# Patient Record
Sex: Female | Born: 1975 | Race: Black or African American | Hispanic: No | Marital: Single | Smoking: Never smoker
Health system: Southern US, Community
[De-identification: ages and names within clinical notes are randomized; demographics above are authoritative.]

## PROBLEM LIST (undated history)

## (undated) HISTORY — PX: TUBAL LIGATION: SHX77

## (undated) HISTORY — PX: HERNIA REPAIR: SHX51

## (undated) HISTORY — PX: ABDOMINAL HYSTERECTOMY: SHX81

---

## 2003-04-04 ENCOUNTER — Emergency Department (HOSPITAL_COMMUNITY): Admission: EM | Admit: 2003-04-04 | Discharge: 2003-04-05 | Payer: Self-pay | Admitting: *Deleted

## 2003-12-06 ENCOUNTER — Emergency Department (HOSPITAL_COMMUNITY): Admission: EM | Admit: 2003-12-06 | Discharge: 2003-12-07 | Payer: Self-pay

## 2004-06-28 ENCOUNTER — Emergency Department (HOSPITAL_COMMUNITY): Admission: EM | Admit: 2004-06-28 | Discharge: 2004-06-28 | Payer: Self-pay | Admitting: Emergency Medicine

## 2006-05-25 ENCOUNTER — Emergency Department (HOSPITAL_COMMUNITY): Admission: EM | Admit: 2006-05-25 | Discharge: 2006-05-25 | Payer: Self-pay | Admitting: Emergency Medicine

## 2009-08-07 ENCOUNTER — Emergency Department (HOSPITAL_COMMUNITY): Admission: EM | Admit: 2009-08-07 | Discharge: 2009-08-07 | Payer: Self-pay | Admitting: Family Medicine

## 2010-04-23 HISTORY — PX: ELBOW SURGERY: SHX618

## 2010-07-11 LAB — POCT RAPID STREP A (OFFICE): Streptococcus, Group A Screen (Direct): NEGATIVE

## 2010-07-21 ENCOUNTER — Emergency Department (HOSPITAL_COMMUNITY): Payer: 59

## 2010-07-21 ENCOUNTER — Encounter (HOSPITAL_COMMUNITY): Payer: Self-pay | Admitting: Radiology

## 2010-07-21 ENCOUNTER — Emergency Department (HOSPITAL_COMMUNITY)
Admission: EM | Admit: 2010-07-21 | Discharge: 2010-07-21 | Disposition: A | Discharge status: Home / Self Care | Payer: 59 | Attending: Emergency Medicine | Admitting: Emergency Medicine

## 2010-07-21 DIAGNOSIS — S52123A Displaced fracture of head of unspecified radius, initial encounter for closed fracture: Secondary | ICD-10-CM | POA: Insufficient documentation

## 2010-07-21 DIAGNOSIS — IMO0002 Reserved for concepts with insufficient information to code with codable children: Secondary | ICD-10-CM | POA: Insufficient documentation

## 2010-07-21 DIAGNOSIS — M25539 Pain in unspecified wrist: Secondary | ICD-10-CM | POA: Insufficient documentation

## 2010-07-21 DIAGNOSIS — R071 Chest pain on breathing: Secondary | ICD-10-CM | POA: Insufficient documentation

## 2010-07-21 DIAGNOSIS — R1012 Left upper quadrant pain: Secondary | ICD-10-CM | POA: Insufficient documentation

## 2010-07-21 LAB — POCT PREGNANCY, URINE: Preg Test, Ur: NEGATIVE

## 2010-07-21 MED ORDER — IOHEXOL 300 MG/ML  SOLN
100.0000 mL | Freq: Once | INTRAMUSCULAR | Status: AC | PRN
  Administered 2010-07-21: 100 mL via INTRAVENOUS

## 2010-07-24 ENCOUNTER — Other Ambulatory Visit: Payer: Self-pay | Admitting: Specialist

## 2010-07-24 DIAGNOSIS — M25531 Pain in right wrist: Secondary | ICD-10-CM

## 2010-07-25 ENCOUNTER — Other Ambulatory Visit: Payer: No Typology Code available for payment source

## 2010-07-25 ENCOUNTER — Ambulatory Visit
Admission: RE | Admit: 2010-07-25 | Discharge: 2010-07-25 | Disposition: A | Discharge status: Home / Self Care | Payer: 59 | Source: Ambulatory Visit | Attending: Specialist | Admitting: Specialist

## 2010-07-25 DIAGNOSIS — M25531 Pain in right wrist: Secondary | ICD-10-CM

## 2010-09-06 ENCOUNTER — Ambulatory Visit (HOSPITAL_BASED_OUTPATIENT_CLINIC_OR_DEPARTMENT_OTHER)
Admission: RE | Admit: 2010-09-06 | Discharge: 2010-09-07 | Disposition: A | Discharge status: Home / Self Care | Payer: 59 | Source: Ambulatory Visit | Attending: Orthopedic Surgery | Admitting: Orthopedic Surgery

## 2010-09-06 DIAGNOSIS — Y9289 Other specified places as the place of occurrence of the external cause: Secondary | ICD-10-CM | POA: Insufficient documentation

## 2010-09-06 DIAGNOSIS — Y998 Other external cause status: Secondary | ICD-10-CM | POA: Insufficient documentation

## 2010-09-06 DIAGNOSIS — E669 Obesity, unspecified: Secondary | ICD-10-CM | POA: Insufficient documentation

## 2010-09-06 DIAGNOSIS — W19XXXA Unspecified fall, initial encounter: Secondary | ICD-10-CM | POA: Insufficient documentation

## 2010-09-06 DIAGNOSIS — IMO0002 Reserved for concepts with insufficient information to code with codable children: Secondary | ICD-10-CM | POA: Insufficient documentation

## 2010-09-06 DIAGNOSIS — S52123A Displaced fracture of head of unspecified radius, initial encounter for closed fracture: Secondary | ICD-10-CM | POA: Insufficient documentation

## 2010-09-06 LAB — POCT HEMOGLOBIN-HEMACUE: Hemoglobin: 13.5 g/dL (ref 12.0–15.0)
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
## 2012-09-22 ENCOUNTER — Ambulatory Visit: Payer: BC Managed Care – PPO

## 2012-09-22 ENCOUNTER — Ambulatory Visit (INDEPENDENT_AMBULATORY_CARE_PROVIDER_SITE_OTHER): Payer: BC Managed Care – PPO | Admitting: Internal Medicine

## 2012-09-22 ENCOUNTER — Other Ambulatory Visit: Payer: Self-pay | Admitting: Internal Medicine

## 2012-09-22 VITALS — BP 108/78 | HR 67 | Temp 97.8°F | Resp 16 | Ht 59.3 in | Wt 170.6 lb

## 2012-09-22 DIAGNOSIS — M25421 Effusion, right elbow: Secondary | ICD-10-CM

## 2012-09-22 DIAGNOSIS — R202 Paresthesia of skin: Secondary | ICD-10-CM

## 2012-09-22 DIAGNOSIS — Z111 Encounter for screening for respiratory tuberculosis: Secondary | ICD-10-CM

## 2012-09-22 DIAGNOSIS — R209 Unspecified disturbances of skin sensation: Secondary | ICD-10-CM

## 2012-09-22 DIAGNOSIS — M25429 Effusion, unspecified elbow: Secondary | ICD-10-CM

## 2012-09-22 NOTE — Patient Instructions (Signed)
Tuberculosis Tuberculosis (TB) is a serious infection that lasts for years if it is not treated. TB usually attacks the lungs, but almost any part of the body can be affected. TB can be cured with medicines. If TB is not treated completely, it damages the lungs and other parts of the body and may be life-threatening. Caregivers are required by law to report all cases of TB to the Department of Health. The Department of Healthhelps to identify other people who have TB and to protect other people from getting TB. CAUSES  TB is caused by a bacteria called Mycobacterium tuberculosis. It is easily spread from person to person (contagious). This can occur when an infected person coughs or sneezes, releasing tiny droplets into the air. Another person can then breathe the bacteria into the lungs, causing infection. SYMPTOMS   Cough.  Weight loss.  Fatigue.  Fever.  Sweating.  Chills.  Loss of appetite. DIAGNOSIS  Your caregiver may perform a skin test. A substance is injected under the skin, and your caregiver will recheck the area in 48 to 72 hours to see how your body reacts. Your caregiver may also use blood tests, chest X-rays, and sputum tests to determine whether you have TB. TREATMENT  TB is treated with antibiotic medicines. You may need to take antibiotics for 6 to 9 months. Antibiotics commonly given for TB include:  Isoniazid.  Rifampin.  Ethambutol.  Pyrazinamide. HOME CARE INSTRUCTIONS   Take your antibiotics as directed. Finish them even if you start to feel better.  Keep all follow-up appointments as directed by your caregiver. Regular follow-up visits are required to make sure your medicines are working. Follow-up visits are needed for at least 2 years to make sure the illness remains under control.  Tell your caregiver about all of the people you live with or have close contact with. Your caregiver or the Department of Health will contact these people about also being  tested for TB.  Eat a well-balanced diet.  Rest as needed.  Until your caregiver says you are no longer contagious:  Avoid close contact with others, especially babies and elderly people. They are much more likely to catch TB.  Cover your mouth and nose when you cough or sneeze. Dispose of used tissues properly.  Wash your hands frequently with soap and water.  Do not go back to work or school. SEEK MEDICAL CARE IF:   You have new problems that may be caused by your medicine.  You lose your appetite, feel nauseous, or vomit.  Your urine becomes dark yellow.  Your skin or the white part of your eyes turns a yellowish color.  Your symptoms do not go away or get worse.  You have a new cough, or your cough lasts longer than 3 to 4 weeks.  You keep losing weight.  The patient is a baby older than 3 months with a rectal temperature of 100.5 F (38.1 C) or higher for more than 1 day. SEEK IMMEDIATE MEDICAL CARE IF:   You have chest pain or cough up blood.  You have trouble breathing or shortness of breath.  You have a headache or neck stiffness.  You have a fever.  The patient is a baby older than 3 months with a rectal temperature of 102 F (38.9 C) or higher.  The patient is a baby 3 months old or younger with a rectal temperature of 100.4 F (38 C) or higher. MAKE SURE YOU:  Understand these instructions.    older than 3 months with a rectal temperature of 102° F (38.9° C) or higher.  · The patient is a baby 3 months old or younger with a rectal temperature of 100.4° F (38° C) or higher.  MAKE SURE YOU:  · Understand these instructions.  · Will watch your condition.  · Will get help right away if you are not doing well or get worse.  Document Released: 04/06/2000 Document Revised: 07/02/2011 Document Reviewed: 12/07/2010  ExitCare® Patient Information ©2014 [employer].

## 2012-09-22 NOTE — Progress Notes (Signed)
  Tuberculosis Risk Questionnaire  1. Were you born outside the [place] in one of the following parts of the world: [place], [place], [place], [place] America or Eastern [place]?  No  2. Have you traveled outside the [place] and lived for more than one month in one of the following parts of the world: [place], [place], [place], [place] or [place]?  No  3. Do you have a compromised immune system such as from any of the following conditions:HIV/AIDS, organ or bone marrow transplantation, diabetes, immunosuppressive medicines (e.g. Prednisone, Remicaide), leukemia, lymphoma, cancer of the head or neck, gastrectomy or jejunal bypass, end-stage renal disease (on dialysis), or silicosis?  No   4. 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  Yes  5. Have you ever: injected illegal drugs, used crack cocaine, lived in a homeless shelter  or been in jail or prison?   No  6. Have you ever been exposed to anyone with infectious tuberculosis?  No   Tuberculosis Symptom Questionnaire  Do you currently have any of the following symptoms?  1. Unexplained cough lasting more than 3 weeks? No  Unexplained fever lasting more than 3 weeks. No   3. Night Sweats (sweating that leaves the bedclothes and sheets wet)   No  4. Shortness of Breath No  5. Chest Pain No  6. Unintentional weight loss  No  7. Unexplained fatigue (very tired for no reason) No  
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
## 2012-11-21 ENCOUNTER — Ambulatory Visit: Payer: BC Managed Care – PPO

## 2012-11-21 ENCOUNTER — Ambulatory Visit (INDEPENDENT_AMBULATORY_CARE_PROVIDER_SITE_OTHER): Payer: BC Managed Care – PPO | Admitting: Family Medicine

## 2012-11-21 VITALS — BP 110/78 | HR 86 | Temp 98.0°F | Resp 16 | Ht 59.0 in | Wt 173.0 lb

## 2012-11-21 DIAGNOSIS — M25521 Pain in right elbow: Secondary | ICD-10-CM

## 2012-11-21 DIAGNOSIS — M25529 Pain in unspecified elbow: Secondary | ICD-10-CM

## 2012-11-21 MED ORDER — TRAMADOL HCL 50 MG PO TABS: 50.0000 mg | ORAL_TABLET | Freq: Three times a day (TID) | ORAL | Status: DC | PRN

## 2012-11-21 MED ORDER — MELOXICAM 7.5 MG PO TABS: ORAL_TABLET | ORAL | Status: DC

## 2012-11-21 NOTE — Patient Instructions (Addendum)
Please see Dr. Merlyn Lot on Monday as planned.  Bring him the copy of your x-ray on CD.  Use the mobic (one or two daily) as needed for pain.  For more severe pain you can use the tramadol, but be careful as it could make you sleepy.    Wear the arm sling as needed for support and comfort.   Remember to perform elbow range of motion exercises several times a day.    If any change or severe pain please call, come back in or go to the ER.

## 2012-11-21 NOTE — Progress Notes (Signed)
[facility] [address] [place] [address] [phone]  Date:  11/21/2012   Name:  Sierra Garner   DOB:  Dec 29, 1975   MRN:  [id number]  PCP:  No primary provider on file.    Chief Complaint: Shoulder Pain   History of Present Illness:  Sierra Garner is a 37 y.o. very pleasant female patient who presents with the following:  Here today with an MSK problem.   For the past week or so she has noted tingling and pain in her right fifth finger.  The tingling will come and go, and sometimes she cannot hold her purse in her right hand. She also notes increasing pain and some swelling over the lateral right elbow.  No heat.     She is right handed.   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   She did break her elbow and had a repair done in 2012 per Dr. Merlyn Lot Sr.    No other injury that she can recall.   Otherwise she is generally healthy  LMP 7/14, s/p BTL  There are no active problems to display for this patient.   Past Medical History  Diagnosis Date  . Ectopic pregnancy     Past Surgical History  Procedure Laterality Date  . Tubal ligation    . Elbow surgery Right 2012    History  Substance Use Topics  . Smoking status: Never Smoker   . Smokeless tobacco: Not on file  . Alcohol Use: No    Family History  Problem Relation Age of Onset  . Diabetes Mother     No Known Allergies  Medication list has been reviewed and updated.  Current Outpatient Prescriptions on File Prior to Visit  Medication Sig Dispense Refill  . Multiple Vitamins-Minerals (MULTIVITAMIN PO) Take 1 tablet by mouth daily.      Marland Kitchen OVER THE COUNTER MEDICATION Take 1 tablet by mouth daily. hydroxycut       No current facility-administered medications on file prior to visit.    Review of Systems:  As per HPI- otherwise negative.   Physical Examination: Filed Vitals:   11/21/12 1159  BP: 110/78  Pulse: 86  Temp: 98 F (36.7 C)  Resp: 16    Filed Vitals:   11/21/12 1159  Height: 4\' 11"  (1.499 m)  Weight: 173 lb (78.472 kg)   Body mass index is 34.92 kg/(m^2). Ideal Body Weight: Weight in (lb) to have BMI = 25: 123.5  GEN: WDWN, NAD, Non-toxic, A & O x 3, looks well HEENT: Atraumatic, Normocephalic. Neck supple. No masses, No LAD. Ears and Nose: No external deformity. CV: RRR, No M/G/R. No JVD. No thrill. No extra heart sounds. PULM: CTA B, no wheezes, crackles, rhonchi. No retractions. No resp. distress. No accessory muscle use. EXTR: No c/c/e NEURO Normal gait.  PSYCH: Normally interactive. Conversant. Not depressed or anxious appearing.  Calm demeanor.  Right arm: tender over the lateral elbow. Slightly swollen over the lateral epicondyle.  Not able to fully extend elbow- lacking about 5 degrees.  Able to pronate and supinate. Notes tingling over the right pinky finger and slightly over the lateral hand.  Good grip strength, normal cap refill all fingers, normal sensation to the rest of the hand.    [facility] reading (PRIMARY) by  Dr. Patsy Lager. Right elbow: pins in place,  No other acute abnormality noted.   RIGHT ELBOW - COMPLETE 3+ VIEW  Comparison: September 22, 2012  Findings: The patient status post prior fixation of the proximal radius without change. There is no acute fracture or dislocation. No posterior fat-pad is identified.  IMPRESSION: No acute abnormality identified. Status post prior fixation of the proximal radius unchanged.  Clinically significant discrepancy from primary report, if provided: None  Assessment and Plan: Right elbow pain - Plan: DG Elbow Complete Right, meloxicam (MOBIC) 7.5 MG tablet, traMADol (ULTRAM) 50 MG tablet  Right elbow pain, suspect lateral epicondylitis.  She has an appt to see Dr. Merlyn Lot on Monday.  Counseled that he may recommend a steroid injection.  For the time being she may use mobic or tramadol for more severe pain.   Placed in sling for comfort, note given for her job.   Perform ROM several times a day to prevent stiffness   Signed Abbe Amsterdam, MD

## 2012-11-25 ENCOUNTER — Encounter: Payer: Self-pay | Admitting: Family Medicine

## 2012-11-25 ENCOUNTER — Telehealth: Payer: Self-pay

## 2012-11-25 NOTE — Telephone Encounter (Signed)
Called, she was not able to see hand surgery due to outstanding balance but would like to RTW as she feels better.

## 2012-11-25 NOTE — Telephone Encounter (Signed)
Dr.Copland, Pt would like to know if you could give her a note to return. Best# [phone]

## 2012-11-25 NOTE — Telephone Encounter (Signed)
Should she get return from hand surgeon, see copy of note you gave her.  Sierra Garner was seen in my clinic on 11/21/2012. She has an acute elbow injury and will not be able to work today or this weekend. She is seeing her hand surgeon on Monday and will then have a better idea of when she will be able to return.  Anticipate that she will be back at work next week.

## 2013-09-15 ENCOUNTER — Emergency Department (HOSPITAL_BASED_OUTPATIENT_CLINIC_OR_DEPARTMENT_OTHER)
Admission: EM | Admit: 2013-09-15 | Discharge: 2013-09-15 | Disposition: A | Discharge status: Home / Self Care | Payer: 59 | Attending: Emergency Medicine | Admitting: Emergency Medicine

## 2013-09-15 ENCOUNTER — Encounter (HOSPITAL_BASED_OUTPATIENT_CLINIC_OR_DEPARTMENT_OTHER): Payer: Self-pay | Admitting: Emergency Medicine

## 2013-09-15 ENCOUNTER — Emergency Department (HOSPITAL_BASED_OUTPATIENT_CLINIC_OR_DEPARTMENT_OTHER): Payer: 59

## 2013-09-15 DIAGNOSIS — L659 Nonscarring hair loss, unspecified: Secondary | ICD-10-CM | POA: Insufficient documentation

## 2013-09-15 DIAGNOSIS — R0789 Other chest pain: Secondary | ICD-10-CM

## 2013-09-15 DIAGNOSIS — Z3202 Encounter for pregnancy test, result negative: Secondary | ICD-10-CM | POA: Insufficient documentation

## 2013-09-15 DIAGNOSIS — Z791 Long term (current) use of non-steroidal anti-inflammatories (NSAID): Secondary | ICD-10-CM | POA: Insufficient documentation

## 2013-09-15 LAB — TSH: TSH: 1.57 u[IU]/mL (ref 0.350–4.500)

## 2013-09-15 LAB — BASIC METABOLIC PANEL
BUN: 9 mg/dL (ref 6–23)
CALCIUM: 9.7 mg/dL (ref 8.4–10.5)
CO2: 25 mEq/L (ref 19–32)
Chloride: 100 mEq/L (ref 96–112)
Creatinine, Ser: 0.8 mg/dL (ref 0.50–1.10)
GFR calc Af Amer: >90 mL/min (ref >90)
Glucose, Bld: 89 mg/dL (ref 70–99)
Potassium: 4.2 mEq/L (ref 3.7–5.3)
SODIUM: 139 meq/L (ref 137–147)

## 2013-09-15 LAB — CBC
HCT: 40.5 % (ref 36.0–46.0)
Hemoglobin: 14 g/dL (ref 12.0–15.0)
MCH: 30.3 pg (ref 26.0–34.0)
MCHC: 34.6 g/dL (ref 30.0–36.0)
MCV: 87.7 fL (ref 78.0–100.0)
PLATELETS: 253 10*3/uL (ref 150–400)
RBC: 4.62 MIL/uL (ref 3.87–5.11)
RDW: 13.1 % (ref 11.5–15.5)
WBC: 5.7 10*3/uL (ref 4.0–10.5)

## 2013-09-15 LAB — DIFFERENTIAL
BASOS ABS: 0 10*3/uL (ref 0.0–0.1)
Basophils Relative: 0 % (ref 0–1)
Eosinophils Absolute: 0 10*3/uL (ref 0.0–0.7)
Eosinophils Relative: 1 % (ref 0–5)
LYMPHS ABS: 1.9 10*3/uL (ref 0.7–4.0)
Lymphocytes Relative: 34 % (ref 12–46)
Monocytes Absolute: 0.4 10*3/uL (ref 0.1–1.0)
Monocytes Relative: 6 % (ref 3–12)
NEUTROS PCT: 59 % (ref 43–77)
Neutro Abs: 3.3 10*3/uL (ref 1.7–7.7)

## 2013-09-15 LAB — URINALYSIS, ROUTINE W REFLEX MICROSCOPIC
Bilirubin Urine: NEGATIVE
Glucose, UA: NEGATIVE mg/dL
Hgb urine dipstick: NEGATIVE
LEUKOCYTES UA: NEGATIVE
NITRITE: NEGATIVE
PH: 6 (ref 5.0–8.0)
Protein, ur: NEGATIVE mg/dL
Specific Gravity, Urine: 1.026 (ref 1.005–1.030)
Urobilinogen, UA: 0.2 mg/dL (ref 0.0–1.0)

## 2013-09-15 LAB — TROPONIN I

## 2013-09-15 LAB — T4: T4 TOTAL: 9.4 ug/dL (ref 5.0–12.5)

## 2013-09-15 LAB — PREGNANCY, URINE: Preg Test, Ur: NEGATIVE

## 2013-09-15 NOTE — ED Notes (Signed)
Pt to room 5 in w/c, triaged while ekg in progress. Pt assisted into gown, reports 3 months of intermittent left sided chest pain radiating to her left arm.

## 2013-09-15 NOTE — Discharge Instructions (Signed)
Chest Pain (Nonspecific) °It is often hard to give a specific diagnosis for the cause of chest pain. There is always a chance that your pain could be related to something serious, such as a heart attack or a blood clot in the lungs. You need to follow up with your caregiver for further evaluation. °CAUSES  °· Heartburn. °· Pneumonia or bronchitis. °· Anxiety or stress. °· Inflammation around your heart (pericarditis) or lung (pleuritis or pleurisy). °· A blood clot in the lung. °· A collapsed lung (pneumothorax). It can develop suddenly on its own (spontaneous pneumothorax) or from injury (trauma) to the chest. °· Shingles infection (herpes zoster virus). °The chest wall is composed of bones, muscles, and cartilage. Any of these can be the source of the pain. °· The bones can be bruised by injury. °· The muscles or cartilage can be strained by coughing or overwork. °· The cartilage can be affected by inflammation and become sore (costochondritis). °DIAGNOSIS  °Lab tests or other studies, such as X-rays, electrocardiography, stress testing, or cardiac imaging, may be needed to find the cause of your pain.  °TREATMENT  °· Treatment depends on what may be causing your chest pain. Treatment may include: °· Acid blockers for heartburn. °· Anti-inflammatory medicine. °· Pain medicine for inflammatory conditions. °· Antibiotics if an infection is present. °· You may be advised to change lifestyle habits. This includes stopping smoking and avoiding alcohol, caffeine, and chocolate. °· You may be advised to keep your head raised (elevated) when sleeping. This reduces the chance of acid going backward from your stomach into your esophagus. °· Most of the time, nonspecific chest pain will improve within 2 to 3 days with rest and mild pain medicine. °HOME CARE INSTRUCTIONS  °· If antibiotics were prescribed, take your antibiotics as directed. Finish them even if you start to feel better. °· For the next few days, avoid physical  activities that bring on chest pain. Continue physical activities as directed. °· Do not smoke. °· Avoid drinking alcohol. °· Only take over-the-counter or prescription medicine for pain, discomfort, or fever as directed by your caregiver. °· Follow your caregiver's suggestions for further testing if your chest pain does not go away. °· Keep any follow-up appointments you made. If you do not go to an appointment, you could develop lasting (chronic) problems with pain. If there is any problem keeping an appointment, you must call to reschedule. °SEEK MEDICAL CARE IF:  °· You think you are having problems from the medicine you are taking. Read your medicine instructions carefully. °· Your chest pain does not go away, even after treatment. °· You develop a rash with blisters on your chest. °SEEK IMMEDIATE MEDICAL CARE IF:  °· You have increased chest pain or pain that spreads to your arm, neck, jaw, back, or abdomen. °· You develop shortness of breath, an increasing cough, or you are coughing up blood. °· You have severe back or abdominal pain, feel nauseous, or vomit. °· You develop severe weakness, fainting, or chills. °· You have a fever. °THIS IS AN EMERGENCY. Do not wait to see if the pain will go away. Get medical help at once. Call your local emergency services (911 in [place] Do not drive yourself to the hospital. °MAKE SURE YOU:  °· Understand these instructions. °· Will watch your condition. °· Will get help right away if you are not doing well or get worse. °Document Released: 01/17/2005 Document Revised: 07/02/2011 Document Reviewed: 11/13/2007 °ExitCare® Patient Information ©2014 [employer]   [employer]. ° °

## 2013-09-15 NOTE — ED Provider Notes (Signed)
CSN: [id number]     Arrival date & time 09/15/13  1541 History   First MD Initiated Contact with Patient 09/15/13 1559     Chief Complaint  Patient presents with  . Chest Pain     (Consider location/radiation/quality/duration/timing/severity/associated sxs/prior Treatment) Patient is a 38 y.o. female presenting with chest pain. The history is provided by the patient.  Chest Pain Pain location:  L chest Pain quality: sharp, shooting and throbbing   Pain radiates to:  L jaw and L shoulder Pain radiates to the back: no   Pain severity:  Moderate Onset quality:  Sudden Duration: 30 min - 1 hour and has been ongiong for 3 months. Timing:  Intermittent Progression:  Waxing and waning Chronicity:  New Context: at rest   Context: no movement, not raising an arm, no stress and no trauma   Context comment:  Not associated with anything that she knows of Relieved by:  None tried Worsened by:  Nothing tried Ineffective treatments:  None tried Associated symptoms: no abdominal pain, no cough, no diaphoresis, no dizziness, no lower extremity edema, no near-syncope, no palpitations, no shortness of breath, no syncope, not vomiting and no weakness   Risk factors: no birth control, no coronary artery disease, no diabetes mellitus, no hypertension, no immobilization, not pregnant, no prior DVT/PE, no smoking and no surgery     Past Medical History  Diagnosis Date  . Ectopic pregnancy    Past Surgical History  Procedure Laterality Date  . Tubal ligation    . Elbow surgery Right 2012   Family History  Problem Relation Age of Onset  . Diabetes Mother    History  Substance Use Topics  . Smoking status: Never Smoker   . Smokeless tobacco: Not on file  . Alcohol Use: No   OB History   Grav Para Term Preterm Abortions TAB SAB Ect Mult Living                 Review of Systems  Constitutional: Negative for diaphoresis, activity change and appetite change.       Alopecia  Respiratory:  Negative for cough and shortness of breath.   Cardiovascular: Positive for chest pain. Negative for palpitations, syncope and near-syncope.  Gastrointestinal: Negative for vomiting and abdominal pain.  Genitourinary: Negative for dysuria.  Neurological: Negative for dizziness and weakness.  All other systems reviewed and are negative.     Allergies  Review of patient's allergies indicates no known allergies.  Home Medications   Prior to Admission medications   Medication Sig Start Date End Date Taking? Authorizing Provider  meloxicam (MOBIC) 7.5 MG tablet Take one or two daily as needed for elbow pain 11/21/12   Pearline Cables, MD  Multiple Vitamins-Minerals (MULTIVITAMIN PO) Take 1 tablet by mouth daily.    Historical Provider, MD  OVER THE COUNTER MEDICATION Take 1 tablet by mouth daily. hydroxycut    Historical Provider, MD  traMADol (ULTRAM) 50 MG tablet Take 1 tablet (50 mg total) by mouth every 8 (eight) hours as needed for pain. 11/21/12   Pearline Cables, MD   LMP 09/02/2013 Physical Exam  Nursing note and vitals reviewed. Constitutional: She is oriented to person, place, and time. She appears well-developed and well-nourished. No distress.  HENT:  Head: Normocephalic and atraumatic.  Mouth/Throat: Oropharynx is clear and moist.  Eyes: Conjunctivae and EOM are normal. Pupils are equal, round, and reactive to light.  Neck: Normal range of motion. Neck supple.  Cardiovascular: Normal  rate, regular rhythm and intact distal pulses.   No murmur heard. Pulmonary/Chest: Effort normal and breath sounds normal. No respiratory distress. She has no wheezes. She has no rales.  Abdominal: Soft. She exhibits no distension. There is no tenderness. There is no rebound and no guarding.  Musculoskeletal: Normal range of motion. She exhibits no edema and no tenderness.  Neurological: She is alert and oriented to person, place, and time.  Skin: Skin is warm and dry. No rash noted. No  erythema.  Psychiatric: She has a normal mood and affect. Her behavior is normal.    ED Course  Procedures (including critical care time) Labs Review Labs Reviewed  URINALYSIS, ROUTINE W REFLEX MICROSCOPIC - Abnormal; Notable for the following:    APPearance CLOUDY (*)    Ketones, ur >80 (*)    All other components within normal limits  PREGNANCY, URINE  TSH  BASIC METABOLIC PANEL  CBC  DIFFERENTIAL  T4  TROPONIN I  CBC WITH DIFFERENTIAL    Imaging Review Dg Chest 2 View  09/15/2013   CLINICAL DATA:  Chest pain  EXAM: CHEST  2 VIEW  COMPARISON:  09/22/2012  FINDINGS: The heart size and mediastinal contours are within normal limits. Both lungs are clear. The visualized skeletal structures are unremarkable.  IMPRESSION: No active cardiopulmonary disease.   Electronically Signed   By: Ruel Favorsrevor  Shick M.D.   On: 09/15/2013 16:43     EKG Interpretation   Date/Time:  Tuesday Sep 15 2013 15:54:23 EDT Ventricular Rate:  78 PR Interval:  156 QRS Duration: 72 QT Interval:  348 QTC Calculation: 396 R Axis:   68 Text Interpretation:  Normal sinus rhythm Normal ECG No previous tracing  Confirmed by Anitra LauthPLUNKETT  MD, Alphonzo LemmingsWHITNEY ([id number]) on 09/15/2013 3:59:18 PM      MDM   Final diagnoses:  Atypical chest pain   Pt with hx of atypical sharp chest pain that started 3 months ago and she has episodes every day.  She had appt with MD today they sent her here.  She has heart score of 0 and no risk factors.  PERC neg.  low suspicion for GI cause for the pain. Patient is currently not having pain but was having pain when she had an EKG done which was within normal limits. She is well appearing with normal vital signs.  Low suspicion for PE.  Patient also states in the last 2 months since the pain started she's also started to lose her hair. We'll check for thyroid disease, anemia. CBC, BMP, troponin, UA, UPT, TSH, T4 Chest x-ray pending. Feel that is the patient's initial troponin is negative she is  effectively ruled out for ACS this patient states she's had pain in her left chest all day long  5:58 PM All labs wnl.  TSH wnl.  Gwyneth SproutWhitney Yigit Norkus, MD 09/15/13 2311

## 2014-07-29 IMAGING — CR DG CHEST 2V
2 series · 2 of 2 positions shown · non-contrast
Comparison: 09/22/2012

CLINICAL DATA: Chest pain

EXAM:
CHEST  2 VIEW

[w chest pa]
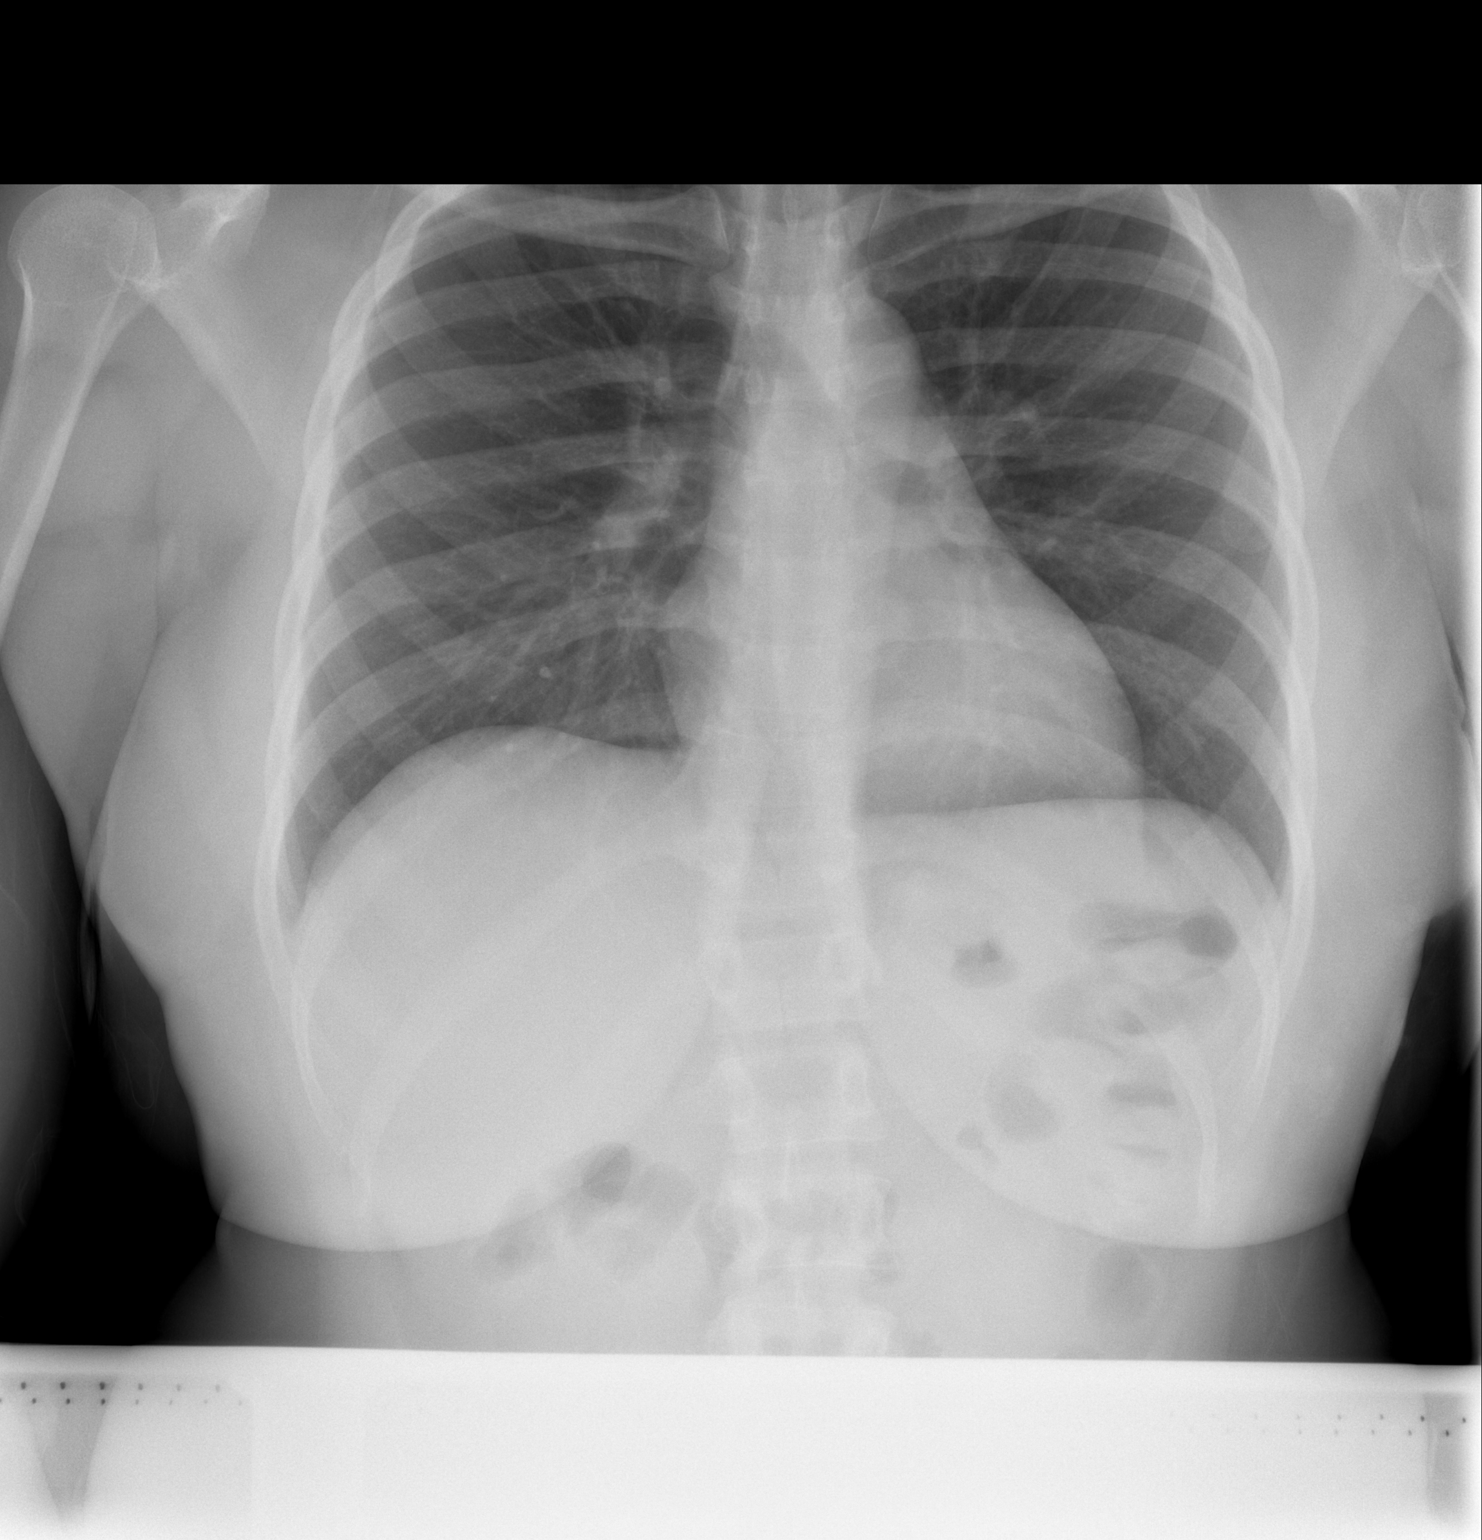

[w chest lat]
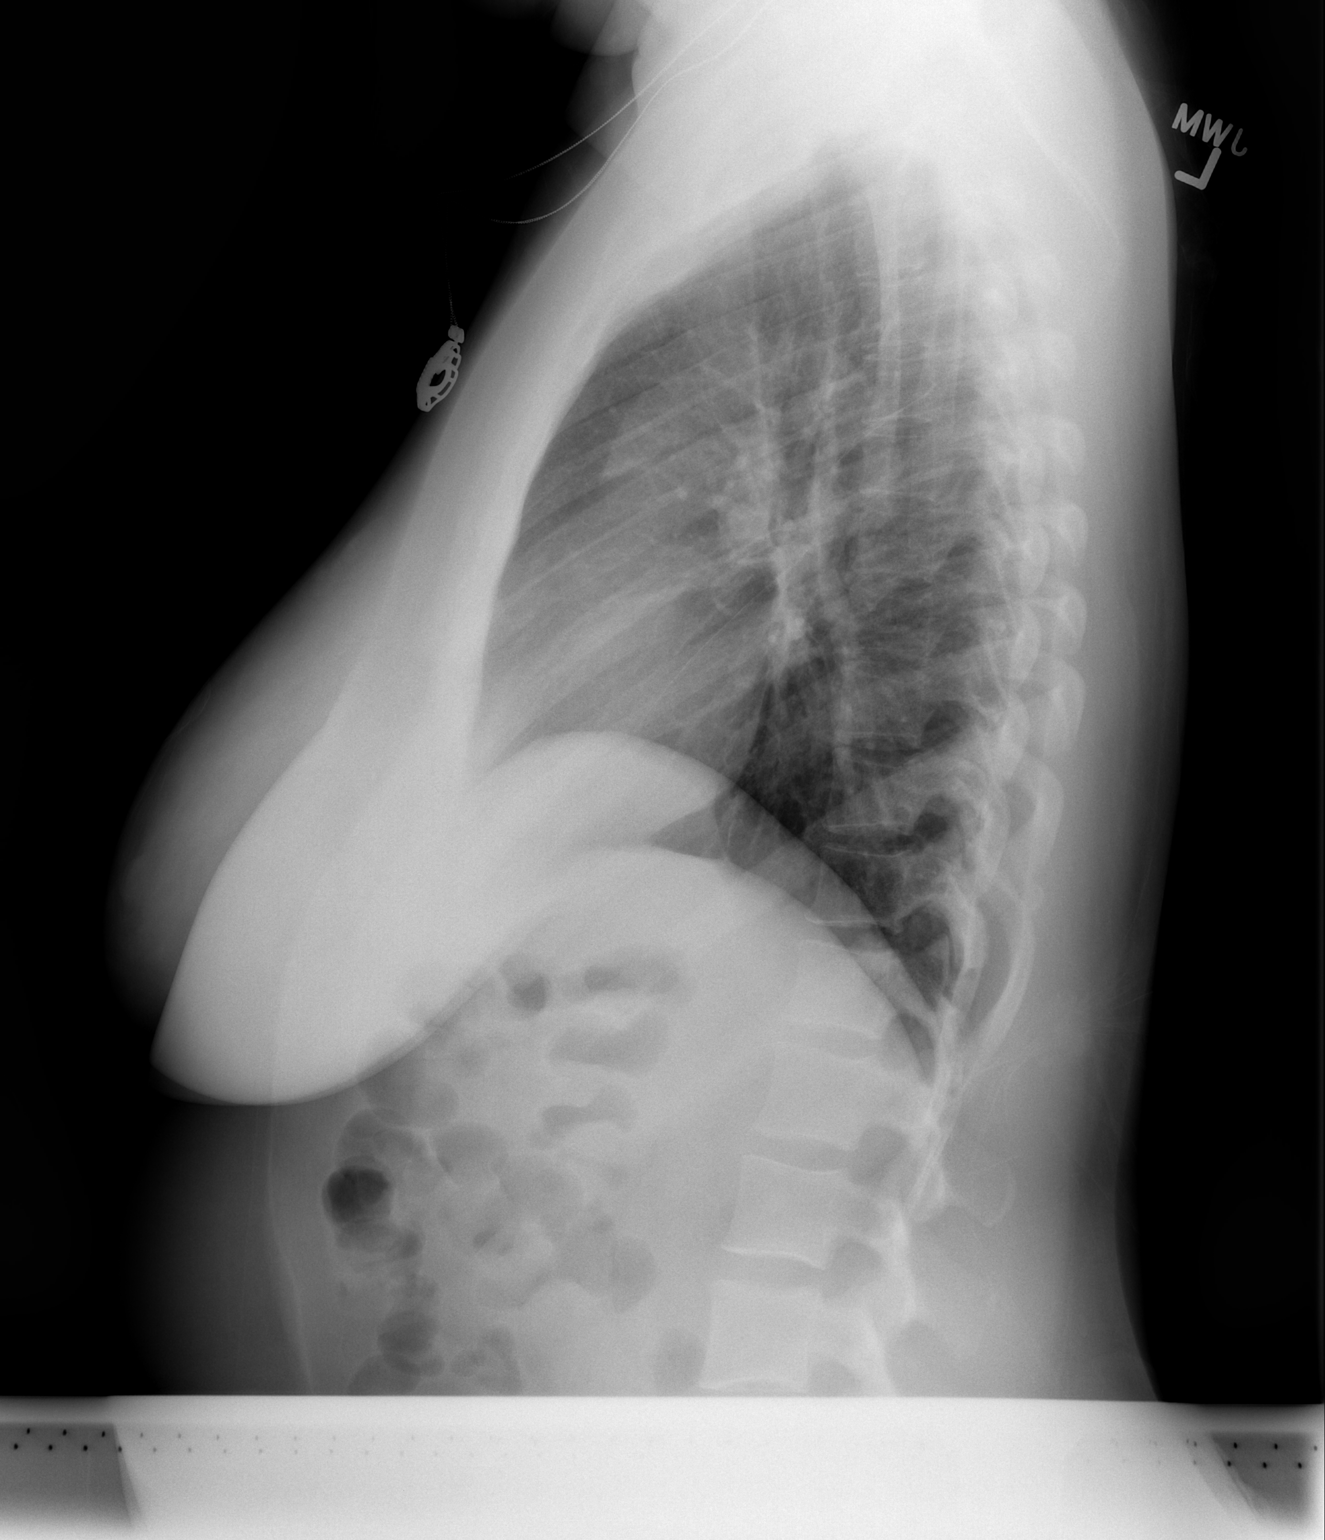

[2 of 2 positions shown; findings below may reference images not displayed]

FINDINGS: The heart size and mediastinal contours are within normal limits.
Both lungs are clear. The visualized skeletal structures are
unremarkable.
IMPRESSION: No active cardiopulmonary disease.

## 2017-07-01 ENCOUNTER — Encounter (HOSPITAL_BASED_OUTPATIENT_CLINIC_OR_DEPARTMENT_OTHER): Payer: Self-pay

## 2017-07-01 ENCOUNTER — Emergency Department (HOSPITAL_BASED_OUTPATIENT_CLINIC_OR_DEPARTMENT_OTHER): Payer: 59

## 2017-07-01 ENCOUNTER — Emergency Department (HOSPITAL_BASED_OUTPATIENT_CLINIC_OR_DEPARTMENT_OTHER)
Admission: EM | Admit: 2017-07-01 | Discharge: 2017-07-01 | Disposition: A | Discharge status: Home / Self Care | Payer: 59 | Attending: Emergency Medicine | Admitting: Emergency Medicine

## 2017-07-01 ENCOUNTER — Other Ambulatory Visit: Payer: Self-pay

## 2017-07-01 DIAGNOSIS — M25461 Effusion, right knee: Secondary | ICD-10-CM | POA: Insufficient documentation

## 2017-07-01 DIAGNOSIS — R091 Pleurisy: Secondary | ICD-10-CM | POA: Insufficient documentation

## 2017-07-01 DIAGNOSIS — Z79899 Other long term (current) drug therapy: Secondary | ICD-10-CM | POA: Diagnosis not present

## 2017-07-01 DIAGNOSIS — R0602 Shortness of breath: Secondary | ICD-10-CM | POA: Diagnosis present

## 2017-07-01 LAB — CBC WITH DIFFERENTIAL/PLATELET
Basophils Absolute: 0 10*3/uL (ref 0.0–0.1)
Basophils Relative: 0 %
Eosinophils Absolute: 0 10*3/uL (ref 0.0–0.7)
Eosinophils Relative: 0 %
HCT: 34.6 % — ABNORMAL LOW (ref 36.0–46.0)
Hemoglobin: 11.1 g/dL — ABNORMAL LOW (ref 12.0–15.0)
LYMPHS ABS: 2.4 10*3/uL (ref 0.7–4.0)
Lymphocytes Relative: 29 %
MCH: 25.1 pg — ABNORMAL LOW (ref 26.0–34.0)
MCHC: 32.1 g/dL (ref 30.0–36.0)
MCV: 78.3 fL (ref 78.0–100.0)
MONOS PCT: 8 %
Monocytes Absolute: 0.7 10*3/uL (ref 0.1–1.0)
NEUTROS PCT: 63 %
Neutro Abs: 5.2 10*3/uL (ref 1.7–7.7)
Platelets: 367 10*3/uL (ref 150–400)
RBC: 4.42 MIL/uL (ref 3.87–5.11)
RDW: 16.7 % — ABNORMAL HIGH (ref 11.5–15.5)
WBC: 8.3 10*3/uL (ref 4.0–10.5)

## 2017-07-01 LAB — BASIC METABOLIC PANEL
Anion gap: 9 (ref 5–15)
BUN: 11 mg/dL (ref 6–20)
CO2: 25 mmol/L (ref 22–32)
Calcium: 9.1 mg/dL (ref 8.9–10.3)
Chloride: 104 mmol/L (ref 101–111)
Creatinine, Ser: 0.74 mg/dL (ref 0.44–1.00)
GFR calc non Af Amer: >60 mL/min (ref >60)
Glucose, Bld: 85 mg/dL (ref 65–99)
Potassium: 3.1 mmol/L — ABNORMAL LOW (ref 3.5–5.1)
Sodium: 138 mmol/L (ref 135–145)

## 2017-07-01 MED ORDER — KETOROLAC TROMETHAMINE 30 MG/ML IJ SOLN
30.0000 mg | Freq: Once | INTRAMUSCULAR | Status: AC
  Administered 2017-07-01: 30 mg via INTRAVENOUS
  Filled 2017-07-01: qty 1

## 2017-07-01 MED ORDER — IOPAMIDOL (ISOVUE-370) INJECTION 76%
100.0000 mL | Freq: Once | INTRAVENOUS | Status: AC | PRN
  Administered 2017-07-01: 100 mL via INTRAVENOUS

## 2017-07-01 MED ORDER — NAPROXEN 500 MG PO TABS: 500.0000 mg | ORAL_TABLET | Freq: Two times a day (BID) | ORAL | 0 refills | Status: DC

## 2017-07-01 NOTE — Progress Notes (Signed)
RT ambulated around department while being monitored by pulse ox. Patient SPO2 remained between 94% to 100%. Patient SPO2 did not drop below 94%. Patient tolerated well.

## 2017-07-01 NOTE — ED Triage Notes (Signed)
Pt reports ShOB since yesterday. States "can't get a full breath." Pt able to speak in full complete sentences without difficulty.   Pt also c/o R knee pain and swelling. Pt states she fell in October.

## 2017-07-01 NOTE — ED Provider Notes (Signed)
[facility] EMERGENCY DEPARTMENT Provider Note   CSN: [id number] Arrival date & time: 07/01/17  1617     History   Chief Complaint Chief Complaint  Patient presents with  . Shortness of Breath    HPI Sierra Garner is a 42 y.o. female.  Patient is a 42 year old female who presents with shortness of breath and upper back pain.  She states she fell down some stairs about 2 weeks ago but does not feel like she injured her ribs.  It has been feeling fine until this yesterday when she started having some shortness of breath and has some pain in her upper back.  It hurts when she breathes.  It is not reproducible.  It does not hurt when she moves.  She denies any cough.  No fevers.  No abdominal pain.  No nausea or vomiting.  No history of DVT.  She also injured her knee when she fell 2 weeks ago.  She has had some ongoing swelling and pain to her right knee.  She denies any other injuries.  No prior issues with her knee.  No pain in her calf or lower leg.  No hip pain or ankle pain.  No numbness or weakness of the leg.  She denies any other injuries from the fall.      Past Medical History:  Diagnosis Date  . Ectopic pregnancy     There are no active problems to display for this patient.   Past Surgical History:  Procedure Laterality Date  . ELBOW SURGERY Right 2012  . TUBAL LIGATION      OB History    No data available       Home Medications    Prior to Admission medications   Medication Sig Start Date End Date Taking? Authorizing Provider  meloxicam (MOBIC) 7.5 MG tablet Take one or two daily as needed for elbow pain 11/21/12   Copland, Gwenlyn Found, MD  Multiple Vitamins-Minerals (MULTIVITAMIN PO) Take 1 tablet by mouth daily.    [provider]  naproxen (NAPROSYN) 500 MG tablet Take 1 tablet (500 mg total) by mouth 2 (two) times daily. 07/01/17   Rolan Bucco, MD  OVER THE COUNTER MEDICATION Take 1 tablet by mouth daily. hydroxycut    [provider]  traMADol (ULTRAM) 50 MG tablet Take 1 tablet (50 mg total) by mouth every 8 (eight) hours as needed for pain. 11/21/12   Copland, Gwenlyn Found, MD    Family History Family History  Problem Relation Age of Onset  . Diabetes Mother     Social History Social History   Tobacco Use  . Smoking status: Never Smoker  . Smokeless tobacco: Never Used  Substance Use Topics  . Alcohol use: No  . Drug use: No     Allergies   Patient has no known allergies.   Review of Systems Review of Systems  Constitutional: Negative for chills, diaphoresis, fatigue and fever.  HENT: Negative for congestion, rhinorrhea and sneezing.   Eyes: Negative.   Respiratory: Positive for shortness of breath. Negative for cough and chest tightness.   Cardiovascular: Negative for chest pain and leg swelling.  Gastrointestinal: Negative for abdominal pain, blood in stool, diarrhea, nausea and vomiting.  Genitourinary: Negative for difficulty urinating, flank pain, frequency and hematuria.  Musculoskeletal: Positive for arthralgias and back pain.  Skin: Negative for rash.  Neurological: Negative for dizziness, speech difficulty, weakness, numbness and headaches.     Physical Exam Updated Vital Signs BP Marland Kitchen)  131/96 (BP Location: Right Arm)   Pulse 74   Temp 98.5 F (36.9 C) (Oral)   Resp 18   Ht 4\' 11"  (1.499 m)   Wt 76.2 kg (168 lb)   LMP 06/13/2017 (Exact Date)   SpO2 100%   BMI 33.93 kg/m   Physical Exam  Constitutional: She is oriented to person, place, and time. She appears well-developed and well-nourished.  HENT:  Head: Normocephalic and atraumatic.  Eyes: Pupils are equal, round, and reactive to light.  Neck: Normal range of motion. Neck supple.  Cardiovascular: Normal rate, regular rhythm and normal heart sounds.  Pulmonary/Chest: Effort normal and breath sounds normal. No respiratory distress. She has no wheezes. She has no rales. She exhibits no tenderness.  Abdominal: Soft.  Bowel sounds are normal. There is no tenderness. There is no rebound and no guarding.  Musculoskeletal: Normal range of motion. She exhibits no edema.  Positive generalized swelling to the right knee.  No large effusion is noted.  No ligament instability is noted.  No warmth or erythema.  She has pain on range of motion of the knee but no palpable bony tenderness.  No pain to the hip or lower leg.  Pedal pulses are intact.  She has normal sensation and motor function to the lower leg.  Lymphadenopathy:    She has no cervical adenopathy.  Neurological: She is alert and oriented to person, place, and time.  Skin: Skin is warm and dry. No rash noted.  Psychiatric: She has a normal mood and affect.     ED Treatments / Results  Labs (all labs ordered are listed, but only abnormal results are displayed) Labs Reviewed  BASIC METABOLIC PANEL - Abnormal; Notable for the following components:      Result Value   Potassium 3.1 (*)    All other components within normal limits  CBC WITH DIFFERENTIAL/PLATELET - Abnormal; Notable for the following components:   Hemoglobin 11.1 (*)    HCT 34.6 (*)    MCH 25.1 (*)    RDW 16.7 (*)    All other components within normal limits    EKG  EKG Interpretation  Date/Time:  Monday July 01 2017 16:31:15 EDT Ventricular Rate:  92 PR Interval:  164 QRS Duration: 70 QT Interval:  344 QTC Calculation: 425 R Axis:   68 Text Interpretation:  Normal sinus rhythm Normal ECG since last tracing no significant change Confirmed by Rolan Bucco [phone]) on 07/01/2017 4:35:04 PM       Radiology Dg Chest 2 View  Result Date: 07/01/2017 CLINICAL DATA:  Shortness of Breath EXAM: CHEST - 2 VIEW COMPARISON:  09/15/2013 FINDINGS: The heart size and mediastinal contours are within normal limits. Both lungs are clear. The visualized skeletal structures are unremarkable. IMPRESSION: No active cardiopulmonary disease. Electronically Signed   By: Alcide Clever M.D.   On:  07/01/2017 16:48   Ct Angio Chest Pe W/cm &/or Wo Cm  Result Date: 07/01/2017 CLINICAL DATA:  42 year old female with shortness of breath. Concern for pulmonary embolism. EXAM: CT ANGIOGRAPHY CHEST WITH CONTRAST TECHNIQUE: Multidetector CT imaging of the chest was performed using the standard protocol during bolus administration of intravenous contrast. Multiplanar CT image reconstructions and MIPs were obtained to evaluate the vascular anatomy. CONTRAST:  ISOVUE-370 IOPAMIDOL (ISOVUE-370) INJECTION 76% COMPARISON:  Chest radiograph dated 07/01/2017 FINDINGS: Cardiovascular: There is no cardiomegaly or pericardial effusion. The thoracic aorta is unremarkable. There is no CT evidence of pulmonary embolism. Mediastinum/Nodes: Right hilar and subcarinal  calcified granuloma. The no adenopathy. The esophagus and thyroid gland are grossly unremarkable. No mediastinal fluid collection. Lungs/Pleura: A 1 cm left lung base calcified granuloma. The lungs are clear. There is no pleural effusion or pneumothorax. The central airways are patent. Upper Abdomen: A 1 cm rounded nodular density in the left upper abdomen anterior to the spleen is indeterminate but may represent a splenium. The visualized upper abdomen is otherwise unremarkable. Musculoskeletal: No chest wall abnormality. No acute or significant osseous findings. Review of the MIP images confirms the above findings. IMPRESSION: No acute intrathoracic pathology. No CT evidence of pulmonary embolism. Electronically Signed   By: Elgie CollardArash  Radparvar M.D.   On: 07/01/2017 20:51   [place] Venous Img Lower Right (dvt Study)  Result Date: 07/01/2017 CLINICAL DATA:  Status post fall down 2 steps ago, with right knee injury. Right knee pain and swelling. EXAM: RIGHT LOWER EXTREMITY VENOUS DOPPLER ULTRASOUND TECHNIQUE: Gray-scale sonography with graded compression, as well as color Doppler and duplex ultrasound were performed to evaluate the lower extremity deep venous  systems from the level of the common femoral vein and including the common femoral, femoral, profunda femoral, popliteal and calf veins including the posterior tibial, peroneal and gastrocnemius veins when visible. The superficial great saphenous vein was also interrogated. Spectral Doppler was utilized to evaluate flow at rest and with distal augmentation maneuvers in the common femoral, femoral and popliteal veins. COMPARISON:  None. FINDINGS: Contralateral Common Femoral Vein: Respiratory phasicity is normal and symmetric with the symptomatic side. No evidence of thrombus. Normal compressibility. Common Femoral Vein: No evidence of thrombus. Normal compressibility, respiratory phasicity and response to augmentation. Saphenofemoral Junction: No evidence of thrombus. Normal compressibility and flow on color Doppler imaging. Profunda Femoral Vein: No evidence of thrombus. Normal compressibility and flow on color Doppler imaging. Femoral Vein: No evidence of thrombus. Normal compressibility, respiratory phasicity and response to augmentation. Popliteal Vein: No evidence of thrombus. Normal compressibility, respiratory phasicity and response to augmentation. Calf Veins: No evidence of thrombus. Normal compressibility and flow on color Doppler imaging. Superficial Great Saphenous Vein: No evidence of thrombus. Normal compressibility. Venous Reflux:  None. Other Findings:  None. IMPRESSION: No evidence of deep venous thrombosis. Electronically Signed   By: Roanna RaiderJeffery  Chang M.D.   On: 07/01/2017 22:40   Dg Knee Complete 4 Views Right  Result Date: 07/01/2017 CLINICAL DATA:  Initial evaluation for acute trauma, fall. Knee pain. EXAM: RIGHT KNEE - COMPLETE 4+ VIEW COMPARISON:  None. FINDINGS: No acute fracture or dislocation. Large joint effusion present within the suprapatellar recess. Joint spaces maintained without evidence for significant degenerative or erosive arthropathy. Osseous mineralization normal. No acute  soft tissue abnormality. IMPRESSION: 1. No acute fracture or dislocation. 2. Large joint effusion within the suprapatellar recess. Electronically Signed   By: Rise MuBenjamin  McClintock M.D.   On: 07/01/2017 20:53    Procedures Procedures (including critical care time)  Medications Ordered in ED Medications  ketorolac (TORADOL) 30 MG/ML injection 30 mg (30 mg Intravenous Given 07/01/17 2124)  iopamidol (ISOVUE-370) 76 % injection 100 mL (100 mLs Intravenous Contrast Given 07/01/17 2033)     Initial Impression / Assessment and Plan / ED Course  I have reviewed the triage vital signs and the nursing notes.  Pertinent labs & imaging results that were available during my care of the patient were reviewed by me and considered in my medical decision making (see chart for details).     Patient has pain to her left upper back.  It  is worse with breathing.  She reports some associated shortness of breath.  She has no hypoxia.  Chest x-ray is clear without suggestions of pneumonia.  Given her associated leg swelling, she had a high suspicion for PE.  CT angiography was performed of the chest which showed no evidence of pulmonary emboli.  No rib fractures.  No pneumonia.  Her symptoms are likely related to pleurisy.  Her pain improved with Toradol.  She also has some right knee pain.  There is no evidence of DVT in the leg.  No bony injury is noted.  No gross ligament instability is noted.  There is an associated effusion.  No suggestions of infection.  She was discharged home in good condition.  She was advised on ice and elevation.  She had a knee sleeve placed.  She was given a referral to follow-up with Dr. Pearletha Forge.  She was also encouraged to follow-up with her PCP regarding of pleurisy.  Return precautions were given.  Her labs are reviewed and are non-concerning.  Final Clinical Impressions(s) / ED Diagnoses   Final diagnoses:  Pleurisy  Knee effusion, right    ED Discharge Orders        Ordered     naproxen (NAPROSYN) 500 MG tablet  2 times daily     07/01/17 2320       Rolan Bucco, MD 07/01/17 2323

## 2017-07-04 ENCOUNTER — Ambulatory Visit: Payer: 59 | Admitting: Family Medicine

## 2017-07-04 ENCOUNTER — Encounter: Payer: Self-pay | Admitting: Family Medicine

## 2017-07-04 DIAGNOSIS — M25561 Pain in right knee: Secondary | ICD-10-CM

## 2017-07-04 NOTE — Patient Instructions (Signed)
I'm concerned you tore the meniscus in your right knee. We will go ahead with an MRI to further assess. Ice the knee 15 minutes at a time 3-4 times a day. Aleve 1-2 tabs twice a day with food or ibuprofen 600 mg 3 times a day with food. Continue using your knee brace during the day to help with swelling and support. Elevate the knee above the level of your heart when possible. Either follow-up with me the day after you have your MRI to go over results for a no charge visit or I will call you with the results and next steps. Do the knee extension and straight leg raise exercises we reviewed 3 sets of 10 once a day. Add an ankle weight these become too easy.

## 2017-07-05 DIAGNOSIS — M25561 Pain in right knee: Secondary | ICD-10-CM | POA: Insufficient documentation

## 2017-07-05 NOTE — Progress Notes (Addendum)
PCP: Cheron Schaumann., MD  Subjective:   HPI: Patient is a 42 y.o. female here for right knee pain.  Patient reports that back on Halloween in 2018 she fell down some stairs and injured her right knee. She had a really bad pain that never completely recovered. The reports about 1 month ago she fell down again and may have twisted her right knee. A lot of swelling and bruising.  She has been limping since this. She is tried Voltaren gel, ibuprofen. Pain is worse at bedtime and hard to flex the knee. Feels tight. Pain is 4-5 out of 10 and sharp. No numbness or tingling, skin changes aside from bruising.  Past Medical History:  Diagnosis Date  . Ectopic pregnancy     Current Outpatient Medications on File Prior to Visit  Medication Sig Dispense Refill  . Multiple Vitamins-Minerals (MULTIVITAMIN PO) Take 1 tablet by mouth daily.    Marland Kitchen OVER THE COUNTER MEDICATION Take 1 tablet by mouth daily. hydroxycut     No current facility-administered medications on file prior to visit.     Past Surgical History:  Procedure Laterality Date  . ELBOW SURGERY Right 2012  . TUBAL LIGATION      No Known Allergies  Social History   Socioeconomic History  . Marital status: Single    Spouse name: Not on file  . Number of children: Not on file  . Years of education: Not on file  . Highest education level: Not on file  Social Needs  . Financial resource strain: Not on file  . Food insecurity - worry: Not on file  . Food insecurity - inability: Not on file  . Transportation needs - medical: Not on file  . Transportation needs - non-medical: Not on file  Occupational History  . Not on file  Tobacco Use  . Smoking status: Never Smoker  . Smokeless tobacco: Never Used  Substance and Sexual Activity  . Alcohol use: No  . Drug use: No  . Sexual activity: Not on file  Other Topics Concern  . Not on file  Social History Narrative  . Not on file    Family History  Problem  Relation Age of Onset  . Diabetes Mother     BP (!) 128/97   Pulse 79   Ht 4\' 11"  (1.499 m)   Wt 168 lb (76.2 kg)   LMP 06/13/2017 (Exact Date)   BMI 33.93 kg/m   Review of Systems: See HPI above.     Objective:  Physical Exam:  Gen: NAD, comfortable in exam room  Right knee: No gross deformity, ecchymoses.  Mild effusion. TTP medial > lateral joint lines. ROM limited to 0-100 degrees, pain on full extent of her flexion. Negative ant/post drawers, levers. Negative valgus/varus testing. Negative lachmanns. Positive thessalys, mcmurrays, apleys.  Negative patellar apprehension. NV intact distally.  Left knee: No deformity. FROM with 5/5 strength. No tenderness to palpation. NVI distally.   Assessment & Plan:  1. Right knee pain -independently reviewed radiographs and no evidence of bony abnormalities.  There is a suggestion of a larger effusion than was seen on today's visit.  I am concerned that she had this injury at the end of October and never fully recovered then with her new injury and has had worsening of her pain and difficulty with flexion.  Her exam is consistent with a meniscus tear that was likely sustained in October and then made larger with her recent fall.  Advised given the  length of time she has had this without improvement on conservative treatment that we go ahead with an MRI to further assess.  Icing, Aleve, knee brace in the meantime.  Elevation to help with swelling.  Shown home exercises to do daily in the meantime.  Follow-up will depend on the MRI results.  Addendum:  MRI reviewed and discussed with patient.  Sequelae of possible MCL sprain and some signal in medial meniscus but no apparent tear.  Reassured her.  Rx given for naproxen twice a day, she will stop by for a home exercise program for the knee.  Consider physical therapy, intraarticular injection if she doesn't continue to improve.

## 2017-07-05 NOTE — Assessment & Plan Note (Signed)
independently reviewed radiographs and no evidence of bony abnormalities.  There is a suggestion of a larger effusion than was seen on today's visit.  I am concerned that she had this injury at the end of October and never fully recovered then with her new injury and has had worsening of her pain and difficulty with flexion.  Her exam is consistent with a meniscus tear that was likely sustained in October and then made larger with her recent fall.  Advised given the length of time she has had this without improvement on conservative treatment that we go ahead with an MRI to further assess.  Icing, Aleve, knee brace in the meantime.  Elevation to help with swelling.  Shown home exercises to do daily in the meantime.  Follow-up will depend on the MRI results.

## 2017-07-08 NOTE — Addendum Note (Signed)
Addended by: Kathi SimpersWISE, Klaire Court F on: 07/08/2017 08:23 AM   Modules accepted: Orders

## 2017-07-15 ENCOUNTER — Encounter: Payer: Self-pay | Admitting: Family Medicine

## 2017-07-15 MED ORDER — NAPROXEN 500 MG PO TABS: 500.0000 mg | ORAL_TABLET | Freq: Two times a day (BID) | ORAL | 2 refills | Status: AC | PRN

## 2017-07-15 NOTE — Addendum Note (Signed)
Addended by: Lenda KelpHUDNALL, Jerrard Bradburn R on: 07/15/2017 03:55 PM   Modules accepted: Orders

## 2017-07-17 ENCOUNTER — Encounter: Payer: Self-pay | Admitting: Family Medicine

## 2019-01-16 ENCOUNTER — Encounter (HOSPITAL_COMMUNITY): Payer: Self-pay | Admitting: Emergency Medicine

## 2019-01-16 ENCOUNTER — Emergency Department (HOSPITAL_COMMUNITY)
Admission: EM | Admit: 2019-01-16 | Discharge: 2019-01-16 | Disposition: A | Discharge status: Home / Self Care | Payer: 59 | Attending: Emergency Medicine | Admitting: Emergency Medicine

## 2019-01-16 ENCOUNTER — Other Ambulatory Visit: Payer: Self-pay

## 2019-01-16 DIAGNOSIS — Z5321 Procedure and treatment not carried out due to patient leaving prior to being seen by health care provider: Secondary | ICD-10-CM | POA: Insufficient documentation

## 2019-01-16 DIAGNOSIS — J029 Acute pharyngitis, unspecified: Secondary | ICD-10-CM | POA: Insufficient documentation

## 2019-01-16 NOTE — ED Notes (Signed)
Pt did not respond when called for vitals check 

## 2019-01-16 NOTE — ED Triage Notes (Signed)
Pt reports she works at [employer], just found out she had a patient that test positive for covid. States sore throat, chills, fatigue and headache that began today.

## 2019-01-16 NOTE — ED Notes (Signed)
Called pt twice for vitals check with no response, not in lobby or outside ED

## 2019-01-22 ENCOUNTER — Other Ambulatory Visit: Payer: Self-pay

## 2019-01-22 ENCOUNTER — Encounter: Payer: Self-pay | Admitting: Cardiology

## 2019-01-22 ENCOUNTER — Ambulatory Visit (INDEPENDENT_AMBULATORY_CARE_PROVIDER_SITE_OTHER): Payer: BC Managed Care – PPO | Admitting: Cardiology

## 2019-01-22 VITALS — BP 116/76 | HR 79 | Temp 97.7°F | Ht 59.0 in | Wt 167.0 lb

## 2019-01-22 DIAGNOSIS — Z01810 Encounter for preprocedural cardiovascular examination: Secondary | ICD-10-CM

## 2019-01-22 NOTE — Progress Notes (Signed)
Primary Physician/Referring:  Glendon Axe, MD  Patient ID: Sierra Garner, female    DOB: Jul 31, 1975, 43 y.o.   MRN: [id number]  Chief Complaint  Patient presents with  . Pre-op clearance  . New Patient (Initial Visit)   HPI:    Sierra Garner  is a 43 y.o. Afro-American female with no history of hypertension, hyperlipidemia, diabetes, nonsmoker, referred to me for evaluation of preoperative cardiac risk stratification due to abnormal EKG.  Patient essentially asymptomatic and had been fairly active, even now she is able to easily climb one to 2 flights of stairs without any shortness of breath or chest pain or palpitations.  Past Medical History:  Diagnosis Date  . Ectopic pregnancy    Past Surgical History:  Procedure Laterality Date  . ELBOW SURGERY Right 2012  . TUBAL LIGATION     Social History   Socioeconomic History  . Marital status: Single    Spouse name: Not on file  . Number of children: Not on file  . Years of education: Not on file  . Highest education level: Not on file  Occupational History  . Not on file  Social Needs  . Financial resource strain: Not on file  . Food insecurity    Worry: Not on file    Inability: Not on file  . Transportation needs    Medical: Not on file    Non-medical: Not on file  Tobacco Use  . Smoking status: Never Smoker  . Smokeless tobacco: Never Used  Substance and Sexual Activity  . Alcohol use: No  . Drug use: No  . Sexual activity: Not on file  Lifestyle  . Physical activity    Days per week: Not on file    Minutes per session: Not on file  . Stress: Not on file  Relationships  . Social Herbalist on phone: Not on file    Gets together: Not on file    Attends religious service: Not on file    Active member of club or organization: Not on file    Attends meetings of clubs or organizations: Not on file    Relationship status: Not on file  . Intimate partner violence    Fear of current or ex partner:  Not on file    Emotionally abused: Not on file    Physically abused: Not on file    Forced sexual activity: Not on file  Other Topics Concern  . Not on file  Social History Narrative  . Not on file   ROS  Review of Systems  Constitution: Negative for chills, decreased appetite, malaise/fatigue and weight gain.  Cardiovascular: Negative for dyspnea on exertion, leg swelling and syncope.  Endocrine: Negative for cold intolerance.  Hematologic/Lymphatic: Does not bruise/bleed easily.  Musculoskeletal: Negative for joint swelling.  Gastrointestinal: Negative for abdominal pain, anorexia, change in bowel habit, hematochezia and melena.  Neurological: Negative for headaches and light-headedness.  Psychiatric/Behavioral: Negative for depression and substance abuse.  All other systems reviewed and are negative.  Objective   Vitals with BMI 01/22/2019 01/16/2019 07/04/2017  Height 4\' 11"  - 4\' 11"   Weight 167 lbs - 168 lbs  BMI 10.93 - 23.55  Systolic 732 202 542  Diastolic 76 87 97  Pulse 79 92 79    Blood pressure 116/76, pulse 79, temperature 97.7 F (36.5 C), height 4\' 11"  (1.499 m), weight 167 lb (75.8 kg), SpO2 100 %. Body mass index is 33.73 kg/m.   Physical Exam  Constitutional:  Short stay check, mildly obese in no acute distress.  HENT:  Head: Atraumatic.  Eyes: Conjunctivae are normal.  Neck: Neck supple. No JVD present. No thyromegaly present.  Cardiovascular: Normal rate, regular rhythm, normal heart sounds and intact distal pulses. Exam reveals no gallop.  No murmur heard. Pulmonary/Chest: Effort normal and breath sounds normal.  Abdominal: Soft. Bowel sounds are normal.  Musculoskeletal: Normal range of motion.  Neurological: She is alert.  Skin: Skin is warm and dry.  Psychiatric: She has a normal mood and affect.   Radiology: No results found.  Laboratory examination:   No results for input(s): NA, K, CL, CO2, GLUCOSE, BUN, CREATININE, CALCIUM, GFRNONAA,  GFRAA in the last 8760 hours. CMP Latest Ref Rng & Units 07/01/2017 09/15/2013  Glucose 65 - 99 mg/dL 85 89  BUN 6 - 20 mg/dL 11 9  Creatinine 1.610.44 - 1.00 mg/dL 0.960.74 0.450.80  Sodium 409135 - 145 mmol/L 138 139  Potassium 3.5 - 5.1 mmol/L 3.1(L) 4.2  Chloride 101 - 111 mmol/L 104 100  CO2 22 - 32 mmol/L 25 25  Calcium 8.9 - 10.3 mg/dL 9.1 9.7   CBC Latest Ref Rng & Units 07/01/2017 09/15/2013 09/06/2010  WBC 4.0 - 10.5 K/uL 8.3 5.7 -  Hemoglobin 12.0 - 15.0 g/dL 11.1(L) 14.0 13.5  Hematocrit 36.0 - 46.0 % 34.6(L) 40.5 -  Platelets 150 - 400 K/uL 367 253 -   Lipid Panel  No results found for: CHOL, TRIG, HDL, CHOLHDL, VLDL, LDLCALC, LDLDIRECT HEMOGLOBIN A1C No results found for: HGBA1C, MPG TSH No results for input(s): TSH in the last 8760 hours. Medications  No Known Allergies   Prior to Admission medications   Medication Sig Start Date End Date Taking? Authorizing Provider  Multiple Vitamins-Minerals (MULTIVITAMIN PO) Take 1 tablet by mouth daily.   Yes [provider]  naproxen (NAPROSYN) 500 MG tablet Take 1 tablet (500 mg total) by mouth 2 (two) times daily as needed. 07/15/17  Yes Hudnall, Azucena FallenShane R, MD  VITAMIN D PO Take 1,000 Units by mouth daily at 2 PM.   Yes [provider]     Current Outpatient Medications  Medication Instructions  . Multiple Vitamins-Minerals (MULTIVITAMIN PO) 1 tablet, Daily  . naproxen (NAPROSYN) 500 mg, Oral, 2 times daily PRN  . VITAMIN D PO 1,000 Units, Oral, Daily    Cardiac Studies:   None available  Assessment     ICD-10-CM   1. Pre-operative cardiovascular examination  Z01.810 EKG 12-Lead    PCV CARDIAC STRESS TEST    EKG 01/22/2019: Normal sinus rhythm at rate of 71 bpm, normal axis.  Poor R-wave progression, probably normal variant. Cannot exclude anterior infarct old.  No evidence of ischemia.  Recommendations:   Afro-American female with no history of hypertension, hyperlipidemia, diabetes, nonsmoker, referred to me  for evaluation of preoperative cardiac risk stratification due to abnormal EKG.  Patient essentially asymptomatic and had been fairly active, even now she is able to easily climb one to 2 flights of stairs without any shortness of breath or chest pain or palpitations.  Her physical examination is completely normal, I suspect her EKG is normal for age, due to body habitus and breast tissue, she has poor R-wave progression.  I do not suspect she has coronary artery disease or myocardial infarction in the past.  I do not think she needs any further cardiac risk stratification, she can be taken up for the upcoming surgery that is liposuction at low risk.  However due to request by Dr. Wallace Cullens, I ordered a routine treadmill exercise stress test.  Unless abnormal I'll see her back on a p.r.n. basis.  CC: Dr. [facility] - coordinator Fax: [phone]    Yates Decamp, MD, [facility] 01/22/2019, 3:22 PM [facility] Pager: [phone] Office: [phone] If no answer Cell [phone]

## 2019-01-26 ENCOUNTER — Ambulatory Visit (INDEPENDENT_AMBULATORY_CARE_PROVIDER_SITE_OTHER): Payer: BC Managed Care – PPO

## 2019-01-26 ENCOUNTER — Other Ambulatory Visit: Payer: Self-pay

## 2019-01-26 DIAGNOSIS — R0789 Other chest pain: Secondary | ICD-10-CM

## 2019-01-26 DIAGNOSIS — Z01810 Encounter for preprocedural cardiovascular examination: Secondary | ICD-10-CM

## 2019-01-28 ENCOUNTER — Telehealth: Payer: Self-pay

## 2019-01-29 ENCOUNTER — Other Ambulatory Visit: Payer: Self-pay

## 2019-01-29 DIAGNOSIS — Z20822 Contact with and (suspected) exposure to covid-19: Secondary | ICD-10-CM

## 2019-01-30 LAB — NOVEL CORONAVIRUS, NAA: SARS-CoV-2, NAA: NOT DETECTED

## 2019-02-04 ENCOUNTER — Other Ambulatory Visit: Payer: Self-pay

## 2019-02-04 DIAGNOSIS — Z20822 Contact with and (suspected) exposure to covid-19: Secondary | ICD-10-CM

## 2019-02-05 LAB — NOVEL CORONAVIRUS, NAA: SARS-CoV-2, NAA: NOT DETECTED

## 2019-03-11 NOTE — Telephone Encounter (Signed)
error 

## 2020-11-21 ENCOUNTER — Other Ambulatory Visit: Payer: Self-pay

## 2020-11-21 ENCOUNTER — Encounter (HOSPITAL_BASED_OUTPATIENT_CLINIC_OR_DEPARTMENT_OTHER): Payer: Self-pay

## 2020-11-21 ENCOUNTER — Emergency Department (HOSPITAL_BASED_OUTPATIENT_CLINIC_OR_DEPARTMENT_OTHER)
Admission: EM | Admit: 2020-11-21 | Discharge: 2020-11-21 | Disposition: A | Discharge status: Home / Self Care | Payer: BC Managed Care – PPO | Attending: Emergency Medicine | Admitting: Emergency Medicine

## 2020-11-21 DIAGNOSIS — U071 COVID-19: Secondary | ICD-10-CM | POA: Diagnosis not present

## 2020-11-21 DIAGNOSIS — R059 Cough, unspecified: Secondary | ICD-10-CM | POA: Diagnosis present

## 2020-11-21 LAB — CBC WITH DIFFERENTIAL/PLATELET
Abs Immature Granulocytes: 0.02 10*3/uL (ref 0.00–0.07)
Basophils Absolute: 0 10*3/uL (ref 0.0–0.1)
Basophils Relative: 0 %
Eosinophils Absolute: 0 10*3/uL (ref 0.0–0.5)
Eosinophils Relative: 0 %
HCT: 39.4 % (ref 36.0–46.0)
Hemoglobin: 13.4 g/dL (ref 12.0–15.0)
Immature Granulocytes: 0 %
Lymphocytes Relative: 18 %
Lymphs Abs: 0.9 10*3/uL (ref 0.7–4.0)
MCH: 29.5 pg (ref 26.0–34.0)
MCHC: 34 g/dL (ref 30.0–36.0)
MCV: 86.8 fL (ref 80.0–100.0)
Monocytes Absolute: 0.6 10*3/uL (ref 0.1–1.0)
Monocytes Relative: 13 %
Neutro Abs: 3.5 10*3/uL (ref 1.7–7.7)
Neutrophils Relative %: 69 %
Platelets: 224 10*3/uL (ref 150–400)
RBC: 4.54 MIL/uL (ref 3.87–5.11)
RDW: 13.4 % (ref 11.5–15.5)
WBC: 5.1 10*3/uL (ref 4.0–10.5)
nRBC: 0 % (ref 0.0–0.2)

## 2020-11-21 LAB — BASIC METABOLIC PANEL
Anion gap: 7 (ref 5–15)
BUN: 9 mg/dL (ref 6–20)
CO2: 26 mmol/L (ref 22–32)
Calcium: 8.9 mg/dL (ref 8.9–10.3)
Chloride: 102 mmol/L (ref 98–111)
Creatinine, Ser: 0.77 mg/dL (ref 0.44–1.00)
GFR, Estimated: >60 mL/min (ref >60)
Glucose, Bld: 109 mg/dL — ABNORMAL HIGH (ref 70–99)
Potassium: 3.4 mmol/L — ABNORMAL LOW (ref 3.5–5.1)
Sodium: 135 mmol/L (ref 135–145)

## 2020-11-21 LAB — RESP PANEL BY RT-PCR (FLU A&B, COVID) ARPGX2
Influenza A by PCR: NEGATIVE
Influenza B by PCR: NEGATIVE
SARS Coronavirus 2 by RT PCR: POSITIVE — AB

## 2020-11-21 MED ORDER — SODIUM CHLORIDE 0.9 % IV BOLUS
1000.0000 mL | Freq: Once | INTRAVENOUS | Status: AC
  Administered 2020-11-21: 1000 mL via INTRAVENOUS

## 2020-11-21 MED ORDER — ACETAMINOPHEN 500 MG PO TABS
1000.0000 mg | ORAL_TABLET | Freq: Once | ORAL | Status: AC
  Administered 2020-11-21: 1000 mg via ORAL
  Filled 2020-11-21: qty 2

## 2020-11-21 NOTE — ED Triage Notes (Signed)
Pt c/o flu like sx x 2 days-NAD-to triage in w/c

## 2020-11-21 NOTE — Discharge Instructions (Addendum)
Please self isolate for the next 5 days (Cleared: 08/06). Continue to wear a mask for an additional 5 days afterwards.   Drink plenty of fluids to stay hydrated. Take Tylenol and Ibuprofen as needed.   Follow up with your PCP regarding ED visit today Return to the ED for any new/worsening symptoms

## 2020-11-21 NOTE — ED Provider Notes (Signed)
[facility] EMERGENCY DEPARTMENT Provider Note   CSN: [id number] Arrival date & time: 11/21/20  1358     History Chief Complaint  Patient presents with   Cough    Sierra Garner is a 45 y.o. female who presents to the ED today with complaint of flu like symptoms for the past 2 days. Pt reports she began having a sore throat, dry cough, body aches, fatigue, headache. She has been at home resting and taking OTC medications however today she got up to go to the bathroom and felt like she was going to pass out. She had to sit back down on her bed and call her daughter to bring her to the ED for further eval. She denies any vomiting or diarrhea however states she is having nausea. She denies any recent sick contacts. Is vaccinated but not boosted.   The history is provided by the patient and medical records.      Past Medical History:  Diagnosis Date   Ectopic pregnancy     Patient Active Problem List   Diagnosis Date Noted   Right knee pain 07/05/2017    Past Surgical History:  Procedure Laterality Date   ELBOW SURGERY Right 2012   TUBAL LIGATION       OB History   No obstetric history on file.     Family History  Problem Relation Age of Onset   Diabetes Mother    Hypertension Sister    Healthy Brother     Social History   Tobacco Use   Smoking status: Never   Smokeless tobacco: Never  Vaping Use   Vaping Use: Never used  Substance Use Topics   Alcohol use: No   Drug use: No    Home Medications Prior to Admission medications   Medication Sig Start Date End Date Taking? Authorizing Provider  Multiple Vitamins-Minerals (MULTIVITAMIN PO) Take 1 tablet by mouth daily.    [provider]  naproxen (NAPROSYN) 500 MG tablet Take 1 tablet (500 mg total) by mouth 2 (two) times daily as needed. 07/15/17   Hudnall, Azucena Fallen, MD  VITAMIN D PO Take 1,000 Units by mouth daily at 2 PM.    [provider]    Allergies    Patient has no known  allergies.  Review of Systems   Review of Systems  Constitutional:  Positive for chills, fatigue and fever.  HENT:  Positive for sore throat.   Respiratory:  Positive for cough.   Gastrointestinal:  Positive for nausea. Negative for diarrhea and vomiting.  Musculoskeletal:  Positive for myalgias.  Neurological:  Positive for headaches.  All other systems reviewed and are negative.  Physical Exam Updated Vital Signs BP (!) 133/114   Pulse 94   Temp 99.6 F (37.6 C) (Oral)   Resp 16   Ht 4\' 11"  (1.499 m)   Wt 78 kg   LMP 11/19/2020   SpO2 100%   BMI 34.74 kg/m   Physical Exam Vitals and nursing note reviewed.  Constitutional:      Appearance: She is not ill-appearing or diaphoretic.  HENT:     Head: Normocephalic and atraumatic.  Eyes:     Conjunctiva/sclera: Conjunctivae normal.  Cardiovascular:     Rate and Rhythm: Normal rate and regular rhythm.     Pulses: Normal pulses.  Pulmonary:     Effort: Pulmonary effort is normal.     Breath sounds: Normal breath sounds. No wheezing or rhonchi.     Comments: Speaking  in full sentences without difficulty. Satting 100% on RA. LCTAB.  Abdominal:     Palpations: Abdomen is soft.     Tenderness: There is no abdominal tenderness. There is no guarding or rebound.  Musculoskeletal:     Cervical back: Neck supple.  Skin:    General: Skin is warm and dry.  Neurological:     Mental Status: She is alert.    ED Results / Procedures / Treatments   Labs (all labs ordered are listed, but only abnormal results are displayed) Labs Reviewed  RESP PANEL BY RT-PCR (FLU A&B, COVID) ARPGX2 - Abnormal; Notable for the following components:      Result Value   SARS Coronavirus 2 by RT PCR POSITIVE (*)    All other components within normal limits  BASIC METABOLIC PANEL - Abnormal; Notable for the following components:   Potassium 3.4 (*)    Glucose, Bld 109 (*)    All other components within normal limits  CBC WITH  DIFFERENTIAL/PLATELET    EKG None  Radiology No results found.  Procedures Procedures   Medications Ordered in ED Medications  sodium chloride 0.9 % bolus 1,000 mL (1,000 mLs Intravenous New Bag/Given 11/21/20 1550)  acetaminophen (TYLENOL) tablet 1,000 mg (1,000 mg Oral Given 11/21/20 1617)    ED Course  I have reviewed the triage vital signs and the nursing notes.  Pertinent labs & imaging results that were available during my care of the patient were reviewed by me and considered in my medical decision making (see chart for details).    MDM Rules/Calculators/A&P                           45 year old female presents to the ED today with flulike symptoms for the past 2 days with lightheadedness/near syncope earlier today prompting ED visit.  On arrival her temp is 99.6, nontachycardic, nontachypneic, appears to be in no acute distress.  On my exam patient appears fatigued however not ill appearing.  She denies any recent sick contacts however I am suspicious for COVID at this time.  We will plan for labs, testing.  We will also plan for orthostatics given complaint of near syncope earlier today.  CBC without leukocytosis.  Hemoglobin stable at 13.4. BMP with a potassium of 3.4.  No other electrolyte abnormalities. COVID test has returned positive today.  Orthostatics: Orthostatic Lying  BP- Lying: 132/88 Pulse- Lying: 95 Orthostatic Sitting BP- Sitting: 135/95 (!) Pulse- Sitting: 96 Orthostatic Standing at 0 minutes BP- Standing at 0 minutes: 127/87 Pulse- Standing at 0 minutes: 108 Orthostatic Standing at 3 minutes BP- Standing at 3 minutes: 131/91 (!) Pulse- Standing at 3 minutes: 112  Orthostatic positive at this time. Started on fluids. Pt also requesting medication for her headache; tylenol provided.   On reevaluation pt resting comfortably. Reports improvement in symptoms.  Will discharge home at this time with instructions to self isolate. Pt is otherwise healthy  female; does not qualify for antivirals at this time. Will have pt follow up with PCP for same.   This note was prepared using Dragon voice recognition software and may include unintentional dictation errors due to the inherent limitations of voice recognition software.  Sierra Garner was evaluated in Emergency Department on 11/21/2020 for the symptoms described in the history of present illness. She was evaluated in the context of the global COVID-19 pandemic, which necessitated consideration that the patient might be at risk for infection with the  SARS-CoV-2 virus that causes COVID-19. Institutional protocols and algorithms that pertain to the evaluation of patients at risk for COVID-19 are in a state of rapid change based on information released by regulatory bodies including the CDC and federal and state organizations. These policies and algorithms were followed during the patient's care in the ED.   Final Clinical Impression(s) / ED Diagnoses Final diagnoses:  COVID-19    Rx / DC Orders ED Discharge Orders     None        Discharge Instructions      Please self isolate for the next 5 days (Cleared: 08/06). Continue to wear a mask for an additional 5 days afterwards.   Drink plenty of fluids to stay hydrated. Take Tylenol and Ibuprofen as needed.   Follow up with your PCP regarding ED visit today Return to the ED for any new/worsening symptoms       Tanda Rockers, PA-C 11/21/20 1646    Jacalyn Lefevre, MD 11/22/20 [phone]

## 2022-07-02 ENCOUNTER — Emergency Department (HOSPITAL_BASED_OUTPATIENT_CLINIC_OR_DEPARTMENT_OTHER): Payer: No Typology Code available for payment source

## 2022-07-02 ENCOUNTER — Emergency Department (HOSPITAL_BASED_OUTPATIENT_CLINIC_OR_DEPARTMENT_OTHER)
Admission: EM | Admit: 2022-07-02 | Discharge: 2022-07-02 | Disposition: A | Discharge status: Home / Self Care | Payer: No Typology Code available for payment source | Attending: Emergency Medicine | Admitting: Emergency Medicine

## 2022-07-02 ENCOUNTER — Encounter (HOSPITAL_BASED_OUTPATIENT_CLINIC_OR_DEPARTMENT_OTHER): Payer: Self-pay

## 2022-07-02 ENCOUNTER — Other Ambulatory Visit: Payer: Self-pay

## 2022-07-02 DIAGNOSIS — Z7982 Long term (current) use of aspirin: Secondary | ICD-10-CM | POA: Diagnosis not present

## 2022-07-02 DIAGNOSIS — R079 Chest pain, unspecified: Secondary | ICD-10-CM

## 2022-07-02 DIAGNOSIS — R110 Nausea: Secondary | ICD-10-CM | POA: Diagnosis not present

## 2022-07-02 DIAGNOSIS — E876 Hypokalemia: Secondary | ICD-10-CM

## 2022-07-02 DIAGNOSIS — R0602 Shortness of breath: Secondary | ICD-10-CM | POA: Diagnosis not present

## 2022-07-02 LAB — CBC
HCT: 38.4 % (ref 36.0–46.0)
Hemoglobin: 13 g/dL (ref 12.0–15.0)
MCH: 29.7 pg (ref 26.0–34.0)
MCHC: 33.9 g/dL (ref 30.0–36.0)
MCV: 87.9 fL (ref 80.0–100.0)
Platelets: 285 10*3/uL (ref 150–400)
RBC: 4.37 MIL/uL (ref 3.87–5.11)
RDW: 13.2 % (ref 11.5–15.5)
WBC: 7.3 10*3/uL (ref 4.0–10.5)
nRBC: 0 % (ref 0.0–0.2)

## 2022-07-02 LAB — BASIC METABOLIC PANEL
Anion gap: 8 (ref 5–15)
BUN: 9 mg/dL (ref 6–20)
CO2: 23 mmol/L (ref 22–32)
Calcium: 8.7 mg/dL — ABNORMAL LOW (ref 8.9–10.3)
Chloride: 104 mmol/L (ref 98–111)
Creatinine, Ser: 0.83 mg/dL (ref 0.44–1.00)
GFR, Estimated: >60 mL/min (ref >60)
Glucose, Bld: 108 mg/dL — ABNORMAL HIGH (ref 70–99)
Potassium: 3.2 mmol/L — ABNORMAL LOW (ref 3.5–5.1)
Sodium: 135 mmol/L (ref 135–145)

## 2022-07-02 LAB — TROPONIN I (HIGH SENSITIVITY)
Troponin I (High Sensitivity): <2 ng/L (ref <18)
Troponin I (High Sensitivity): 4 ng/L (ref <18)

## 2022-07-02 MED ORDER — ASPIRIN 81 MG PO CHEW: 81.0000 mg | CHEWABLE_TABLET | Freq: Every day | ORAL | 1 refills | Status: AC

## 2022-07-02 MED ORDER — POTASSIUM CHLORIDE CRYS ER 20 MEQ PO TBCR
40.0000 meq | EXTENDED_RELEASE_TABLET | Freq: Once | ORAL | Status: AC
  Administered 2022-07-02: 40 meq via ORAL
  Filled 2022-07-02: qty 2

## 2022-07-02 NOTE — ED Provider Notes (Signed)
[facility] EMERGENCY DEPARTMENT [facility] Provider Note   CSN: [id number] Arrival date & time: 07/02/22  0059     History  Chief Complaint  Patient presents with   Chest Pain    Sierra Garner is a 47 y.o. female.  47 year old female without significant past medical history, non-smoker who presents the ER today secondary to chest pain.  Patient states that started as central chest pain and seem to radiate up in both shoulders.  Had a pressure component to it.  Has some nausea with the and some shortness of breath as well.  No other associated symptoms.  No history of symptoms similar to this.  No family history of cardiac disease that she knows of.  No recent fever or cough.   Chest Pain      Home Medications Prior to Admission medications   Medication Sig Start Date End Date Taking? Authorizing Provider  aspirin 81 MG chewable tablet Chew 1 tablet (81 mg total) by mouth daily. 07/02/22  Yes Eythan Jayne, Corene Cornea, MD  Multiple Vitamins-Minerals (MULTIVITAMIN PO) Take 1 tablet by mouth daily.    [provider]  naproxen (NAPROSYN) 500 MG tablet Take 1 tablet (500 mg total) by mouth 2 (two) times daily as needed. 07/15/17   Hudnall, Sharyn Lull, MD  VITAMIN D PO Take 1,000 Units by mouth daily at 2 PM.    [provider]      Allergies    Patient has no known allergies.    Review of Systems   Review of Systems  Cardiovascular:  Positive for chest pain.    Physical Exam Updated Vital Signs BP 111/80   Pulse 78   Temp 98 F (36.7 C) (Oral)   Resp 18   Ht '4\' 11"'$  (1.499 m)   Wt 79.4 kg   LMP  (LMP Unknown) Comment: tubal ligation, says she does not have a cycle  SpO2 97%   BMI 35.35 kg/m  Physical Exam Vitals and nursing note reviewed.  Constitutional:      Appearance: She is well-developed.  HENT:     Head: Normocephalic and atraumatic.  Cardiovascular:     Rate and Rhythm: Normal rate and regular rhythm.  Pulmonary:     Effort: No  respiratory distress.     Breath sounds: No stridor. No decreased breath sounds or wheezing.  Abdominal:     General: There is no distension.     Palpations: Abdomen is soft.  Musculoskeletal:        General: Normal range of motion.     Cervical back: Normal range of motion.  Skin:    General: Skin is warm and dry.  Neurological:     Mental Status: She is alert.     ED Results / Procedures / Treatments   Labs (all labs ordered are listed, but only abnormal results are displayed) Labs Reviewed  BASIC METABOLIC PANEL - Abnormal; Notable for the following components:      Result Value   Potassium 3.2 (*)    Glucose, Bld 108 (*)    Calcium 8.7 (*)    All other components within normal limits  CBC  PREGNANCY, URINE  TROPONIN I (HIGH SENSITIVITY)  TROPONIN I (HIGH SENSITIVITY)    EKG EKG Interpretation  Date/Time:  Monday July 02 2022 01:10:46 EDT Ventricular Rate:  90 PR Interval:  188 QRS Duration: 87 QT Interval:  336 QTC Calculation: 412 R Axis:   56 Text Interpretation: Sinus rhythm Low voltage, precordial  leads Borderline T wave abnormalities No significant change since last tracing Confirmed by Merrily Pew [phone]) on 07/02/2022 1:59:09 AM  Radiology DG Chest 2 View  Result Date: 07/02/2022 CLINICAL DATA:  Chest pain. EXAM: CHEST - 2 VIEW COMPARISON:  Chest radiograph dated 07/01/2017. FINDINGS: The heart size and mediastinal contours are within normal limits. Both lungs are clear. The visualized skeletal structures are unremarkable. IMPRESSION: No active cardiopulmonary disease. Electronically Signed   By: Anner Crete M.D.   On: 07/02/2022 01:28    Procedures Procedures    Medications Ordered in ED Medications  potassium chloride SA (KLOR-CON M) CR tablet 40 mEq (40 mEq Oral Given 07/02/22 0544)    ED Course/ Medical Decision Making/ A&P             HEART Score: 3                Medical Decision Making Amount and/or Complexity of Data  Reviewed Labs: ordered. Radiology: ordered.  Risk OTC drugs. Prescription drug management.  Slightly hypokalemic, heart score of 3, normal EKG.  Will get delta troponins and reevaluate for disposition by suspect she can go home with cardiology follow-up. Low suspicion for PE.  Second troponin negative. Chest pain consistently improved. Sleeping comfortably. VS reassuring. Low suspicion for ACS but sill need cards follow up for echo/stress.  Final Clinical Impression(s) / ED Diagnoses Final diagnoses:  Nonspecific chest pain  Hypokalemia    Rx / DC Orders ED Discharge Orders          Ordered    aspirin 81 MG chewable tablet  Daily        07/02/22 0537    Ambulatory referral to Cardiology        07/02/22 0537              Shoaib Siefker, Corene Cornea, MD 07/02/22 [phone]

## 2022-07-02 NOTE — ED Triage Notes (Addendum)
Pt arrives with c/o chest pain that started about an hour ago. Pt endorses dizziness, SOB, and nausea. Pt describes pain has a heaviness.

## 2022-07-11 ENCOUNTER — Ambulatory Visit: Payer: BC Managed Care – PPO | Attending: Internal Medicine | Admitting: Internal Medicine

## 2022-07-11 NOTE — Progress Notes (Deleted)
Cardiology Office Note:    Date:  07/11/2022   ID:  Sierra Garner, DOB October 06, 1975, MRN [id number]  PCP:  Sierra Garner, [place] Providers Cardiologist:  None { Click to update primary MD,subspecialty MD or APP then REFRESH:1}    Referring MD: Sierra Axe, MD   No chief complaint on file. ***  History of Present Illness:    Sierra Garner is a 47 y.o. female with no sig pmhx, referral from the ED for CP. Notes central CP with radiation in both shoulders. Noted minor pressure/nausea.  Past Medical History:  Diagnosis Date   Ectopic pregnancy     Past Surgical History:  Procedure Laterality Date   ELBOW SURGERY Right 2012   TUBAL LIGATION      Current Medications: No outpatient medications have been marked as taking for the 07/11/22 encounter (Appointment) with Janina Mayo, MD.     Allergies:   Patient has no known allergies.   Social History   Socioeconomic History   Marital status: Single    Spouse name: Not on file   Number of children: Not on file   Years of education: Not on file   Highest education level: Not on file  Occupational History   Not on file  Tobacco Use   Smoking status: Never   Smokeless tobacco: Never  Vaping Use   Vaping Use: Never used  Substance and Sexual Activity   Alcohol use: No   Drug use: No   Sexual activity: Not on file  Other Topics Concern   Not on file  Social History Narrative   Not on file   Social Determinants of Health   Financial Resource Strain: Not on file  Food Insecurity: Not on file  Transportation Needs: Not on file  Physical Activity: Not on file  Stress: Not on file  Social Connections: Not on file     Family History: The patient's ***family history includes Diabetes in her mother; Healthy in her brother; Hypertension in her sister.  ROS:   Please see the history of present illness.    *** All other systems reviewed and are negative.  EKGs/Labs/Other Studies Reviewed:    The  following studies were reviewed today: ***  EKG:  EKG is *** ordered today.  The ekg ordered today demonstrates ***  Recent Labs: 07/02/2022: BUN 9; Creatinine, Ser 0.83; Hemoglobin 13.0; Platelets 285; Potassium 3.2; Sodium 135  Recent Lipid Panel No results found for: "CHOL", "TRIG", "HDL", "CHOLHDL", "VLDL", "LDLCALC", "LDLDIRECT"   Risk Assessment/Calculations:   {Does this patient have ATRIAL FIBRILLATION?:[phone]}  No BP recorded.  {Refresh Note OR Click here to enter BP  :1}***         Physical Exam:    VS:  LMP  (LMP Unknown) Comment: tubal ligation, says she does not have a cycle    Wt Readings from Last 3 Encounters:  07/02/22 175 lb (79.4 kg)  11/21/20 172 lb (78 kg)  01/22/19 167 lb (75.8 kg)     GEN: *** Well nourished, well developed in no acute distress HEENT: Normal NECK: No JVD; No carotid bruits LYMPHATICS: No lymphadenopathy CARDIAC: ***RRR, no murmurs, rubs, gallops RESPIRATORY:  Clear to auscultation without rales, wheezing or rhonchi  ABDOMEN: Soft, non-tender, non-distended MUSCULOSKELETAL:  No edema; No deformity  SKIN: Warm and dry NEUROLOGIC:  Alert and oriented x 3 PSYCHIATRIC:  Normal affect   ASSESSMENT:    No diagnosis found. PLAN:    In order of problems listed  above:  ***      {Are you ordering a CV Procedure (e.g. stress test, cath, DCCV, TEE, etc)?   Press F2        :[id number]    Medication Adjustments/Labs and Tests Ordered: Current medicines are reviewed at length with the patient today.  Concerns regarding medicines are outlined above.  No orders of the defined types were placed in this encounter.  No orders of the defined types were placed in this encounter.   There are no Patient Instructions on file for this visit.   Signed, Janina Mayo, MD  07/11/2022 4:19 PM    [place]

## 2022-08-09 ENCOUNTER — Ambulatory Visit: Payer: BC Managed Care – PPO | Attending: Internal Medicine | Admitting: Internal Medicine

## 2022-08-09 VITALS — BP 135/88 | HR 76 | Ht 59.0 in | Wt 192.0 lb

## 2022-08-09 DIAGNOSIS — R0789 Other chest pain: Secondary | ICD-10-CM | POA: Diagnosis not present

## 2022-08-09 NOTE — Patient Instructions (Signed)
Medication Instructions:  Your physician recommends that you continue on your current medications as directed. Please refer to the Current Medication list given to you today.  *If you need a refill on your cardiac medications before your next appointment, please call your pharmacy*   Follow-Up: At [facility], you and your health needs are our priority.  As part of our continuing mission to provide you with exceptional heart care, we have created designated Provider Care Teams.  These Care Teams include your primary Cardiologist (physician) and Advanced Practice Providers (APPs -  Physician Assistants and Nurse Practitioners) who all work together to provide you with the care you need, when you need it.  We recommend signing up for the patient portal called "MyChart".  Sign up information is provided on this After Visit Summary.  MyChart is used to connect with patients for Virtual Visits (Telemedicine).  Patients are able to view lab/test results, encounter notes, upcoming appointments, etc.  Non-urgent messages can be sent to your provider as well.   To learn more about what you can do with MyChart, go to [email or web address].    Your next appointment:   We will see you on an as needed basis.  Provider:   Mary Branch, MD 

## 2022-08-09 NOTE — Progress Notes (Signed)
Cardiology Office Note:    Date:  08/10/2022   ID:  Sierra Garner, DOB 09-Apr-1976, MRN [id number]  PCP:  Caffie Damme, MD   [facility] Providers Cardiologist:  None     Referring MD: Caffie Damme, MD   No chief complaint on file. CP  History of Present Illness:    Sierra Garner is a 47 y.o. female with no pmhx referral from the ED. In March, she had sharp chest pain and heaviness. She was sitting down on the edge of the bed. She stood up and took asa.She went to the ED. The pain improved on arrival.  EKG was normal. Troponin was negative.  No cardiac hx. She did an exercise stress test in 2020 with 10 METS, no ischemic changes.  She saw Dr. Jacinto Halim at that time for a pre-op for liposuction at the request of her surgeon. No family hx of  premature CAD. No smoking hx. She walks on the treadmill, no CP or SOB.   Past Medical History:  Diagnosis Date   Ectopic pregnancy     Past Surgical History:  Procedure Laterality Date   ELBOW SURGERY Right 2012   TUBAL LIGATION      Current Medications: Current Meds  Medication Sig   aspirin 81 MG chewable tablet Chew 1 tablet (81 mg total) by mouth daily.   Multiple Vitamins-Minerals (MULTIVITAMIN PO) Take 1 tablet by mouth daily.   naproxen (NAPROSYN) 500 MG tablet Take 1 tablet (500 mg total) by mouth 2 (two) times daily as needed.   VITAMIN D PO Take 1,000 Units by mouth daily at 2 PM.     Allergies:   Patient has no known allergies.   Social History   Socioeconomic History   Marital status: Single    Spouse name: Not on file   Number of children: Not on file   Years of education: Not on file   Highest education level: Not on file  Occupational History   Not on file  Tobacco Use   Smoking status: Never   Smokeless tobacco: Never  Vaping Use   Vaping Use: Never used  Substance and Sexual Activity   Alcohol use: No   Drug use: No   Sexual activity: Not on file  Other Topics Concern   Not on file  Social  History Narrative   Not on file   Social Determinants of Health   Financial Resource Strain: Not on file  Food Insecurity: Not on file  Transportation Needs: Not on file  Physical Activity: Not on file  Stress: Not on file  Social Connections: Not on file     Family History: The patient's family history includes Diabetes in her mother; Healthy in her brother; Hypertension in her sister.  ROS:   Please see the history of present illness.     All other systems reviewed and are negative.  EKGs/Labs/Other Studies Reviewed:    The following studies were reviewed today:   EKG:  EKG is  ordered today.  The ekg ordered today demonstrates   08/09/2022- NSR  Recent Labs: 07/02/2022: BUN 9; Creatinine, Ser 0.83; Hemoglobin 13.0; Platelets 285; Potassium 3.2; Sodium 135   Recent Lipid Panel No results found for: "CHOL", "TRIG", "HDL", "CHOLHDL", "VLDL", "LDLCALC", "LDLDIRECT"   Risk Assessment/Calculations:     Physical Exam:    VS:  BP 135/88   Pulse 76   Ht  (1.499 m)   Wt 192 lb (87.1 kg)   SpO2 98%  BMI 38.78 kg/m     Wt Readings from Last 3 Encounters:  08/09/22 192 lb (87.1 kg)  07/02/22 175 lb (79.4 kg)  11/21/20 172 lb (78 kg)     GEN:  Well nourished, well developed in no acute distress HEENT: Normal NECK: No JVD; No carotid bruits CARDIAC: RRR, no murmurs, rubs, gallops RESPIRATORY:  Clear to auscultation without rales, wheezing or rhonchi  ABDOMEN: Soft, non-tender, non-distended MUSCULOSKELETAL:  No edema; No deformity  SKIN: Warm and dry NEUROLOGIC:  Alert and oriented x 3 PSYCHIATRIC:  Normal affect   ASSESSMENT:    Non cardiac CP:  She had atypical sharp CP x1 while sitting. Her EKG is normal. She has low risk for coronary disease.  This is non cardiac CP PLAN:    In order of problems listed above:  No cardiac disease No further cardiac w/u indicated        Medication Adjustments/Labs and Tests Ordered: Current medicines are  reviewed at length with the patient today.  Concerns regarding medicines are outlined above.  Orders Placed This Encounter  Procedures   EKG 12-Lead   No orders of the defined types were placed in this encounter.   Patient Instructions  Medication Instructions:  Your physician recommends that you continue on your current medications as directed. Please refer to the Current Medication list given to you today.  *If you need a refill on your cardiac medications before your next appointment, please call your pharmacy*   Follow-Up: At [facility], you and your health needs are our priority.  As part of our continuing mission to provide you with exceptional heart care, we have created designated Provider Care Teams.  These Care Teams include your primary Cardiologist (physician) and Advanced Practice Providers (APPs -  Physician Assistants and Nurse Practitioners) who all work together to provide you with the care you need, when you need it.  We recommend signing up for the patient portal called "MyChart".  Sign up information is provided on this After Visit Summary.  MyChart is used to connect with patients for Virtual Visits (Telemedicine).  Patients are able to view lab/test results, encounter notes, upcoming appointments, etc.  Non-urgent messages can be sent to your provider as well.   To learn more about what you can do with MyChart, go to [email or web address].    Your next appointment:   We will see you on an as needed basis.   Provider:   Carolan Clines, MD   Signed, Maisie Fus, MD  08/10/2022 10:14 AM    [facility]

## 2024-02-01 ENCOUNTER — Emergency Department (HOSPITAL_BASED_OUTPATIENT_CLINIC_OR_DEPARTMENT_OTHER)

## 2024-02-01 ENCOUNTER — Encounter (HOSPITAL_BASED_OUTPATIENT_CLINIC_OR_DEPARTMENT_OTHER): Payer: Self-pay | Admitting: Emergency Medicine

## 2024-02-01 ENCOUNTER — Emergency Department (HOSPITAL_BASED_OUTPATIENT_CLINIC_OR_DEPARTMENT_OTHER): Admission: EM | Admit: 2024-02-01 | Discharge: 2024-02-01 | Disposition: A | Discharge status: Home / Self Care

## 2024-02-01 ENCOUNTER — Other Ambulatory Visit: Payer: Self-pay

## 2024-02-01 DIAGNOSIS — Z7982 Long term (current) use of aspirin: Secondary | ICD-10-CM | POA: Insufficient documentation

## 2024-02-01 DIAGNOSIS — M5412 Radiculopathy, cervical region: Secondary | ICD-10-CM | POA: Insufficient documentation

## 2024-02-01 DIAGNOSIS — M542 Cervicalgia: Secondary | ICD-10-CM | POA: Diagnosis present

## 2024-02-01 MED ORDER — DEXAMETHASONE 4 MG PO TABS
8.0000 mg | ORAL_TABLET | Freq: Once | ORAL | Status: AC
  Administered 2024-02-01: 8 mg via ORAL
  Filled 2024-02-01: qty 2

## 2024-02-01 MED ORDER — KETOROLAC TROMETHAMINE 15 MG/ML IJ SOLN
15.0000 mg | Freq: Once | INTRAMUSCULAR | Status: AC
  Administered 2024-02-01: 15 mg via INTRAMUSCULAR
  Filled 2024-02-01: qty 1

## 2024-02-01 MED ORDER — LIDOCAINE 5 % EX PTCH: 1.0000 | MEDICATED_PATCH | CUTANEOUS | 0 refills | Status: AC

## 2024-02-01 MED ORDER — LIDOCAINE 5 % EX PTCH
1.0000 | MEDICATED_PATCH | Freq: Once | CUTANEOUS | Status: DC
  Administered 2024-02-01: 1 via TRANSDERMAL
  Filled 2024-02-01: qty 1

## 2024-02-01 MED ORDER — ACETAMINOPHEN 500 MG PO TABS
1000.0000 mg | ORAL_TABLET | Freq: Once | ORAL | Status: AC
  Administered 2024-02-01: 1000 mg via ORAL
  Filled 2024-02-01: qty 2

## 2024-02-01 MED ORDER — CYCLOBENZAPRINE HCL 10 MG PO TABS: 10.0000 mg | ORAL_TABLET | Freq: Two times a day (BID) | ORAL | 0 refills | Status: AC | PRN

## 2024-02-01 NOTE — ED Notes (Signed)
 ED Provider at bedside.

## 2024-02-01 NOTE — ED Triage Notes (Signed)
 Left shoulder pain, woke up 3 days ago, denies injury or Hx of same.

## 2024-02-01 NOTE — Discharge Instructions (Addendum)
 Stop taking Robaxin.  He may take Tylenol  alternating with ibuprofen every 4 hours for baseline pain control.  You may use the lidocaine patches and Flexeril for further pain relief.  Please follow-up with your primary doctor.  As discussed you may do gentle mobility exercises/stretching of your neck.  You may also benefit from physical therapy/massage therapy; please discuss these options with your primary doctor.

## 2024-02-01 NOTE — ED Provider Notes (Signed)
 [place] [facility] Provider Note   CSN: [id number] Arrival date & time: 02/01/24  9251     Patient presents with: Shoulder Pain   Sierra Garner is a 48 y.o. female.   Is a 48 year old presenting emergency department with left neck pain/shoulder pain.  Symptoms ongoing past week.  Awoke from sleep with symptoms.  Saw primary doctor was prescribed Robaxin has not been helping.  No trauma or inciting event that she can recall.  No constitutional symptoms, no weakness or numbness in her upper extremities.no fevers.  She is not immunosuppressed.  No cancer history.  No steroid use.  No IV drug use history.   Shoulder Pain      Prior to Admission medications   Medication Sig Start Date End Date Taking? Authorizing Provider  cyclobenzaprine (FLEXERIL) 10 MG tablet Take 1 tablet (10 mg total) by mouth 2 (two) times daily as needed for muscle spasms. 02/01/24  Yes Zakhia Seres, Caron J, DO  lidocaine (LIDODERM) 5 % Place 1 patch onto the skin daily. Remove & Discard patch within 12 hours or as directed by MD 02/01/24  Yes Neysa Caron PARAS, DO  aspirin  81 MG chewable tablet Chew 1 tablet (81 mg total) by mouth daily. 07/02/22   Mesner, Selinda, MD  Multiple Vitamins-Minerals (MULTIVITAMIN PO) Take 1 tablet by mouth daily.    [provider]  naproxen  (NAPROSYN ) 500 MG tablet Take 1 tablet (500 mg total) by mouth 2 (two) times daily as needed. 07/15/17   Hudnall, Ludie SAUNDERS, MD  VITAMIN D PO Take 1,000 Units by mouth daily at 2 PM.    [provider]    Allergies: Patient has no known allergies.    Review of Systems  Updated Vital Signs BP (!) 130/92   Pulse 71   Temp 97.9 F (36.6 C) (Oral)   Resp 14   Ht 4' 11 (1.499 m)   Wt 81.6 kg   LMP  (LMP Unknown) Comment: tubal ligation, says she does not have a cycle  SpO2 100%   BMI 36.36 kg/m   Physical Exam Vitals and nursing note reviewed.  Constitutional:      General: She is not in  acute distress.    Appearance: She is not toxic-appearing.  HENT:     Head: Normocephalic.     Mouth/Throat:     Mouth: Mucous membranes are moist.  Eyes:     Conjunctiva/sclera: Conjunctivae normal.  Neck:     Comments: Does have some tenderness along the left lower paraspinal cervical musculature.  Also have some tenderness along her upper trapezius/scapular border.  No midline tenderness. Cardiovascular:     Rate and Rhythm: Normal rate and regular rhythm.  Pulmonary:     Effort: Pulmonary effort is normal.     Breath sounds: Normal breath sounds.  Musculoskeletal:     Cervical back: Normal range of motion. Tenderness present.     Comments: Full range of motion at the shoulder joint.  No tenderness to palpation.  No erythema or warmth.  Good strength.  Able to make okay sign cross fingers over make fist with and spread fingers out and resist opposition.  2+ radial pulses.  Soft compartments.  Skin:    Capillary Refill: Capillary refill takes less than 2 seconds.  Neurological:     Mental Status: She is alert and oriented to person, place, and time.  Psychiatric:        Mood and Affect: Mood normal.  Behavior: Behavior normal.     (all labs ordered are listed, but only abnormal results are displayed) Labs Reviewed - No data to display  EKG: EKG Interpretation Date/Time:  Saturday February 01 2024 08:00:30 EDT Ventricular Rate:  80 PR Interval:  181 QRS Duration:  78 QT Interval:  376 QTC Calculation: 434 R Axis:   68  Text Interpretation: Sinus rhythm Low voltage, precordial leads Confirmed by Neysa Clap [phone]) on 02/01/2024 9:04:45 AM  Radiology: [facility] Shoulder Left Result Date: 02/01/2024 EXAM: 1 VIEW XRAY OF THE LEFT SHOULDER 02/01/2024 09:27:28 AM COMPARISON: None available. CLINICAL HISTORY: Shoulder pain. Left shoulder pain, woke up 3 days ago, denies injury or Hx of same. FINDINGS: BONES AND JOINTS: Glenohumeral joint is normally aligned. No acute fracture  or dislocation. The [facility] joint is unremarkable in appearance. SOFT TISSUES: No abnormal calcifications. Visualized lung is unremarkable. IMPRESSION: 1. Normal shoulder radiographs. No acute abnormality detected. Electronically signed by: Waddell Calk MD 02/01/2024 09:58 AM EDT RP Workstation: [id number]     Procedures   Medications Ordered in the ED  lidocaine (LIDODERM) 5 % 1 patch (1 patch Transdermal Patch Applied 02/01/24 0827)  acetaminophen  (TYLENOL ) tablet 1,000 mg (1,000 mg Oral Given 02/01/24 0825)  ketorolac  (TORADOL ) 15 MG/ML injection 15 mg (15 mg Intramuscular Given 02/01/24 0828)  dexamethasone (DECADRON) tablet 8 mg (8 mg Oral Given 02/01/24 0825)    Clinical Course as of 02/01/24 1009  Sat Feb 01, 2024  0803 Was seen at [facility] on 10/9 for seemingly similar symptoms diagnosed with muscle spasm and prescribed Robaxin [TY]  1005 DG Shoulder Left MPRESSION: 1. Normal shoulder radiographs. No acute abnormality detected.   [TY]  1008 Patient is feeling some improvement after medications on my reevaluation.  Will discharge in stable condition [TY]    Clinical Course User Index [TY] Neysa Clap PARAS, DO                                 Medical Decision Making Is a 48 year old female presenting emergency department with left neck/upper back/shoulder pain.  She is afebrile nontachycardic, slightly hypertensive.  Physical exam largely reassuring.  MSK/cervical radiculopathy.  No red flags I would necessitate MRI/CT scans.  Low suspicion for infectious process, will forego labs as her symptoms are seemingly MSK.  X-ray ordered at patient's request to evaluate for acute pathology in the shoulder.  Treated with multimodal pain medication.  Discussed supportive care and outpatient follow-up.  Will give number to Ortho that she can follow-up with if she continues to have symptoms.  Amount and/or Complexity of Data Reviewed Radiology: ordered. Decision-making details documented in ED  Course.  Risk OTC drugs. Prescription drug management.       Final diagnoses:  Cervical radiculopathy    ED Discharge Orders          Ordered    lidocaine (LIDODERM) 5 %  Every 24 hours        02/01/24 0815    cyclobenzaprine (FLEXERIL) 10 MG tablet  2 times daily PRN        02/01/24 0815               Neysa Clap PARAS, DO 02/01/24 1009

## 2024-04-17 ENCOUNTER — Other Ambulatory Visit: Payer: Self-pay

## 2024-04-17 ENCOUNTER — Encounter (HOSPITAL_BASED_OUTPATIENT_CLINIC_OR_DEPARTMENT_OTHER): Payer: Self-pay

## 2024-04-17 ENCOUNTER — Emergency Department (HOSPITAL_BASED_OUTPATIENT_CLINIC_OR_DEPARTMENT_OTHER)
Admission: EM | Admit: 2024-04-17 | Discharge: 2024-04-17 | Disposition: A | Discharge status: Home / Self Care | Attending: Emergency Medicine | Admitting: Emergency Medicine

## 2024-04-17 DIAGNOSIS — Z7982 Long term (current) use of aspirin: Secondary | ICD-10-CM | POA: Insufficient documentation

## 2024-04-17 DIAGNOSIS — N611 Abscess of the breast and nipple: Secondary | ICD-10-CM | POA: Insufficient documentation

## 2024-04-17 DIAGNOSIS — N644 Mastodynia: Secondary | ICD-10-CM | POA: Diagnosis present

## 2024-04-17 MED ORDER — FLUCONAZOLE 150 MG PO TABS: 150.0000 mg | ORAL_TABLET | Freq: Every day | ORAL | 0 refills | Status: AC

## 2024-04-17 MED ORDER — DOXYCYCLINE HYCLATE 100 MG PO TABS
100.0000 mg | ORAL_TABLET | Freq: Once | ORAL | Status: AC
  Administered 2024-04-17: 100 mg via ORAL
  Filled 2024-04-17: qty 1

## 2024-04-17 NOTE — ED Triage Notes (Signed)
 Pt states that she lost her nipple ring and the hole has closed up and has drainage coming out of the other hole. Drainage also coming out through nipple itself. Now having pain starting 3 days ago.

## 2024-04-17 NOTE — ED Provider Notes (Signed)
 "  EMERGENCY DEPARTMENT AT [facility] Provider Note   CSN: [id number] Arrival date & time: 04/17/24  1129     Patient presents with: Abscess   Sierra Garner is a 48 y.o. female.   Patient to ED with pain and swelling of the right nipple that started 3 days ago. She had nipple piercings until 3 months ago when she removed them for her annual mammogram. Three days ago she noticed a painful swelling associated with one of the piercing hole that had closed. She reports she was able to express pus from this area. No bleeding.   The history is provided by the patient. No language interpreter was used.  Abscess      Prior to Admission medications  Medication Sig Start Date End Date Taking? Authorizing Provider  fluconazole  (DIFLUCAN ) 150 MG tablet Take 1 tablet (150 mg total) by mouth daily for 1 day. 04/17/24 04/18/24 Yes Odell Balls, PA-C  aspirin  81 MG chewable tablet Chew 1 tablet (81 mg total) by mouth daily. 07/02/22   Mesner, Selinda, MD  cyclobenzaprine  (FLEXERIL ) 10 MG tablet Take 1 tablet (10 mg total) by mouth 2 (two) times daily as needed for muscle spasms. 02/01/24   Neysa Caron PARAS, DO  lidocaine  (LIDODERM ) 5 % Place 1 patch onto the skin daily. Remove & Discard patch within 12 hours or as directed by MD 02/01/24   Neysa Caron PARAS, DO  Multiple Vitamins-Minerals (MULTIVITAMIN PO) Take 1 tablet by mouth daily.    [provider]  naproxen  (NAPROSYN ) 500 MG tablet Take 1 tablet (500 mg total) by mouth 2 (two) times daily as needed. 07/15/17   Hudnall, Ludie SAUNDERS, MD  VITAMIN D PO Take 1,000 Units by mouth daily at 2 PM.    [provider]    Allergies: Patient has no known allergies.    Review of Systems  Updated Vital Signs BP (!) 150/91 (BP Location: Right Arm)   Pulse 77   Temp 97.7 F (36.5 C) (Oral)   Resp 16   Ht 4' 11 (1.499 m)   Wt 83.9 kg   LMP  (LMP Unknown) Comment: tubal ligation, says she does not have a cycle  SpO2  100%   BMI 37.37 kg/m   Physical Exam Constitutional:      General: She is not in acute distress.    Appearance: She is well-developed. She is not ill-appearing.  Pulmonary:     Effort: Pulmonary effort is normal.  Chest:       Comments: Small nodule to the medial aspect of right nipple. No active drainage. No surrounding induration or redness. No nipple discharge or bleeding.  Musculoskeletal:        General: Normal range of motion.     Cervical back: Normal range of motion.  Skin:    General: Skin is warm and dry.  Neurological:     Mental Status: She is alert and oriented to person, place, and time.     (all labs ordered are listed, but only abnormal results are displayed) Labs Reviewed - No data to display  EKG: None  Radiology: No results found.   Procedures   Medications Ordered in the ED  doxycycline  (VIBRA -TABS) tablet 100 mg (has no administration in time range)    Clinical Course as of 04/17/24 1431  Fri Apr 17, 2024  1430 Patient with small nodular infection adjacent to right nipple. No surrounding induration or redness. No active drainage. Will start Doxycycline . She will  return in 3 days for recheck of the infection. Warm compresses. Return precautions. [SU]    Clinical Course User Index [SU] Odell Balls, PA-C                                 Medical Decision Making Risk Prescription drug management.        Final diagnoses:  Breast abscess of female    ED Discharge Orders          Ordered    fluconazole  (DIFLUCAN ) 150 MG tablet  Daily        04/17/24 1429               Odell Balls, PA-C 04/17/24 1431    Ruthe Cornet, DO 04/17/24 1451  "

## 2024-04-17 NOTE — Discharge Instructions (Signed)
 As we discussed, take the antibiotic as prescribed. Warm compresses are also recommended. Tylenol  and/or ibuprofen for any discomfort. Return here in 3 days for recheck of the infection, sooner if you develop a fever, worsening or severe pain, or if you feel the infection is growing in size.

## 2024-08-04 ENCOUNTER — Ambulatory Visit: Payer: Self-pay | Admitting: Family Medicine
